# Patient Record
Sex: Female | Born: 1970 | Race: White | Hispanic: No | Marital: Married | Smoking: Former smoker
Health system: Southern US, Community
[De-identification: ages and names within clinical notes are randomized; demographics above are authoritative.]

## PROBLEM LIST (undated history)

## (undated) DIAGNOSIS — E042 Nontoxic multinodular goiter: Secondary | ICD-10-CM

## (undated) DIAGNOSIS — G709 Myoneural disorder, unspecified: Secondary | ICD-10-CM

## (undated) DIAGNOSIS — T7840XA Allergy, unspecified, initial encounter: Secondary | ICD-10-CM

## (undated) DIAGNOSIS — K219 Gastro-esophageal reflux disease without esophagitis: Secondary | ICD-10-CM

## (undated) DIAGNOSIS — K222 Esophageal obstruction: Secondary | ICD-10-CM

## (undated) DIAGNOSIS — R7301 Impaired fasting glucose: Secondary | ICD-10-CM

## (undated) DIAGNOSIS — E785 Hyperlipidemia, unspecified: Secondary | ICD-10-CM

## (undated) HISTORY — PX: COLONOSCOPY: SHX174

## (undated) HISTORY — DX: Esophageal obstruction: K22.2

## (undated) HISTORY — DX: Myoneural disorder, unspecified: G70.9

## (undated) HISTORY — DX: Allergy, unspecified, initial encounter: T78.40XA

## (undated) HISTORY — DX: Impaired fasting glucose: R73.01

## (undated) HISTORY — DX: Hyperlipidemia, unspecified: E78.5

## (undated) HISTORY — DX: Gastro-esophageal reflux disease without esophagitis: K21.9

## (undated) HISTORY — PX: OTHER SURGICAL HISTORY: SHX169

## (undated) HISTORY — DX: Nontoxic multinodular goiter: E04.2

## (undated) HISTORY — PX: UPPER GASTROINTESTINAL ENDOSCOPY: SHX188

---

## 1998-11-23 ENCOUNTER — Other Ambulatory Visit: Admission: RE | Admit: 1998-11-23 | Discharge: 1998-11-23 | Payer: Self-pay | Admitting: Obstetrics and Gynecology

## 1999-08-23 ENCOUNTER — Other Ambulatory Visit: Admission: RE | Admit: 1999-08-23 | Discharge: 1999-08-23 | Payer: Self-pay | Admitting: Obstetrics and Gynecology

## 2005-04-26 ENCOUNTER — Inpatient Hospital Stay (HOSPITAL_COMMUNITY): Admission: AD | Admit: 2005-04-26 | Discharge: 2005-04-26 | Payer: Self-pay | Admitting: Obstetrics & Gynecology

## 2005-06-07 ENCOUNTER — Other Ambulatory Visit: Admission: RE | Admit: 2005-06-07 | Discharge: 2005-06-07 | Payer: Self-pay | Admitting: Obstetrics and Gynecology

## 2005-12-24 ENCOUNTER — Other Ambulatory Visit: Admission: RE | Admit: 2005-12-24 | Discharge: 2005-12-24 | Payer: Self-pay | Admitting: Obstetrics and Gynecology

## 2006-07-16 ENCOUNTER — Inpatient Hospital Stay (HOSPITAL_COMMUNITY): Admission: RE | Admit: 2006-07-16 | Discharge: 2006-07-18 | Payer: Self-pay | Admitting: Obstetrics and Gynecology

## 2011-10-26 ENCOUNTER — Encounter: Payer: Self-pay | Admitting: Internal Medicine

## 2011-11-19 ENCOUNTER — Ambulatory Visit (INDEPENDENT_AMBULATORY_CARE_PROVIDER_SITE_OTHER): Payer: 59 | Admitting: Internal Medicine

## 2011-11-19 ENCOUNTER — Encounter: Payer: Self-pay | Admitting: Internal Medicine

## 2011-11-19 VITALS — BP 120/70 | HR 76 | Ht 69.0 in | Wt 170.0 lb

## 2011-11-19 DIAGNOSIS — K219 Gastro-esophageal reflux disease without esophagitis: Secondary | ICD-10-CM

## 2011-11-19 DIAGNOSIS — Z800 Family history of malignant neoplasm of digestive organs: Secondary | ICD-10-CM

## 2011-11-19 MED ORDER — ESOMEPRAZOLE MAGNESIUM 40 MG PO CPDR: 40.0000 mg | DELAYED_RELEASE_CAPSULE | Freq: Every day | ORAL | Status: DC

## 2011-11-19 NOTE — Patient Instructions (Signed)
You have been scheduled for an endoscopy and colonoscopy with propofol.  Please follow the written instructions given to you at your visit today. Please pick up yourprep at the pharmacy within the next 2-3 days.  You have been given samples of Nexium to try.  If it works well you may take Prilosec over the counter.  You have been given some information on GERD to read.

## 2011-11-19 NOTE — Progress Notes (Signed)
HISTORY OF PRESENT ILLNESS:  Natasha Jenkins is a 41 y.o. female with no significant past medical history who presents today regarding chronic reflux and a family history of colon cancer. First, the patient reports a 10 year history of problems with acid reflux. Symptoms including regurgitation, pyrosis, and chest discomfort. She is bothered with symptoms at least 2-3 times per week for which she takes Zantac on demand. She denies dysphagia. Next, there is a family history of colon cancer in her father (mid to late 26s), and maternal grandmother. Mother has a history of precancerous polyps. The patient herself does mention some minor intermittent rectal bleeding as manifested by red blood on the tissue without associated rectal pain. GI review of systems is otherwise negative  REVIEW OF SYSTEMS:  All non-GI ROS entirely negative.  Past Medical History  Diagnosis Date  . GERD (gastroesophageal reflux disease)     patient report    History reviewed. No pertinent past surgical history.  Social History Arelene Moroni  reports that she has been smoking.  She has never used smokeless tobacco. She reports that she drinks alcohol. She reports that she does not use illicit drugs.  family history includes Colon cancer in her father and maternal grandmother; Colon polyps in her mother; Heart disease in her maternal grandmother; and Prostate cancer in her paternal grandfather.  No Known Allergies     PHYSICAL EXAMINATION: Vital signs: BP 120/70  Pulse 76  Ht 5\' 9"  (1.753 m)  Wt 170 lb (77.111 kg)  BMI 25.10 kg/m2  LMP 11/16/2011  Constitutional: generally well-appearing, no acute distress Psychiatric: alert and oriented x3, cooperative Eyes: extraocular movements intact, anicteric, conjunctiva pink Mouth: oral pharynx moist, no lesions Neck: supple no lymphadenopathy Cardiovascular: heart regular rate and rhythm, no murmur Lungs: clear to auscultation bilaterally Abdomen: soft, nontender,  nondistended, no obvious ascites, no peritoneal signs, normal bowel sounds, no organomegaly Rectal:deferred until colonoscopy Extremities: no lower extremity edema bilaterally Skin: no lesions on visible extremities Neuro: No focal deficits.   ASSESSMENT:  #1. Chronic GERD requiring chronic H2 receptor antagonist therapy. #2. Family history of colon cancer in a first (father in his 17s) and second-degree relative. #3. Trivial rectal bleeding.    PLAN:  #1. Reflux precautions #2. Nexium 40 mg daily samples provided. This is helpful, then she can pick up Prevacid therapy she or we would be happy to call in a prescription PPI. #3. Screening colonoscopy. She is an appropriate candidate without contraindication.The nature of the procedure, as well as the risks, benefits, and alternatives were carefully and thoroughly reviewed with the patient. Ample time for discussion and questions allowed. The patient understood, was satisfied, and agreed to proceed. Movi prep prescribed. Patient instructed on its use

## 2011-11-29 ENCOUNTER — Telehealth: Payer: Self-pay | Admitting: Internal Medicine

## 2011-11-29 MED ORDER — PEG-KCL-NACL-NASULF-NA ASC-C 100 G PO SOLR: 1.0000 | Freq: Once | ORAL | Status: DC

## 2011-11-29 NOTE — Telephone Encounter (Signed)
Rx sent to pharmacy   

## 2011-11-29 NOTE — Telephone Encounter (Signed)
Pt aware.

## 2011-12-10 ENCOUNTER — Ambulatory Visit (AMBULATORY_SURGERY_CENTER): Payer: 59 | Admitting: Internal Medicine

## 2011-12-10 ENCOUNTER — Encounter: Payer: Self-pay | Admitting: Internal Medicine

## 2011-12-10 VITALS — BP 129/82 | HR 80 | Temp 97.5°F | Resp 20 | Ht 69.0 in | Wt 170.0 lb

## 2011-12-10 DIAGNOSIS — Z1211 Encounter for screening for malignant neoplasm of colon: Secondary | ICD-10-CM

## 2011-12-10 DIAGNOSIS — K222 Esophageal obstruction: Secondary | ICD-10-CM

## 2011-12-10 DIAGNOSIS — Z800 Family history of malignant neoplasm of digestive organs: Secondary | ICD-10-CM

## 2011-12-10 DIAGNOSIS — K219 Gastro-esophageal reflux disease without esophagitis: Secondary | ICD-10-CM

## 2011-12-10 MED ORDER — SODIUM CHLORIDE 0.9 % IV SOLN: 500.0000 mL | INTRAVENOUS | Status: DC

## 2011-12-10 MED ORDER — OMEPRAZOLE 20 MG PO CPDR: 20.0000 mg | DELAYED_RELEASE_CAPSULE | Freq: Every day | ORAL | Status: DC

## 2011-12-10 NOTE — Progress Notes (Signed)
Patient did not experience any of the following events: a burn prior to discharge; a fall within the facility; wrong site/side/patient/procedure/implant event; or a hospital transfer or hospital admission upon discharge from the facility. (G8907) Patient did not have preoperative order for IV antibiotic SSI prophylaxis. (G8918)  

## 2011-12-10 NOTE — Op Note (Signed)
[facility] [address] [place], [place]  [address]  COLONOSCOPY PROCEDURE REPORT  PATIENT:  Natasha Jenkins, Natasha Jenkins  MR#:  [id number] BIRTHDATE:  1971/05/27, 40 yrs. old  GENDER:  female ENDOSCOPIST:  Wilhemina Bonito. Eda Keys, MD REF. BY:  Kari Baars, M.D. PROCEDURE DATE:  12/10/2011 PROCEDURE:  Higher-risk screening colonoscopy G0105  ASA CLASS:  Class I INDICATIONS:  Elevated Risk Screening, family history of colon cancer ; father w/ CRC 40's MEDICATIONS:   MAC sedation, administered by CRNA, propofol (Diprivan) 280 mg IV  DESCRIPTION OF PROCEDURE:   After the risks benefits and alternatives of the procedure were thoroughly explained, informed consent was obtained.  Digital rectal exam was performed and revealed no abnormalities.   The LB 180AL [id number] endoscope was introduced through the anus and advanced to the cecum, which was identified by both the appendix and ileocecal valve, without limitations.  The quality of the prep was excellent, using MoviPrep.  The instrument was then slowly withdrawn as the colon was fully examined. <<PROCEDUREIMAGES>>  FINDINGS:  A normal appearing cecum, ileocecal valve, and appendiceal orifice were identified. The ascending, hepatic flexure, transverse, splenic flexure, descending, sigmoid colon, and rectum appeared unremarkable.  No polyps or cancers were seen. Retroflexed views in the rectum revealed no abnormalities.  The time to cecum = 2:09  minutes. The scope was then withdrawn in 8:23  minutes from the cecum and the procedure completed.  COMPLICATIONS:  None  ENDOSCOPIC IMPRESSION: 1) Normal colon 2) No polyps or cancers  RECOMMENDATIONS: 1) Follow up colonoscopy in 5 years (Family hx)  ______________________________ Wilhemina Bonito. Eda Keys, MD  CC:  Kari Baars, MD; The Patient  n. eSIGNED:   Wilhemina Bonito. Eda Keys at 12/10/2011 02:03 PM  Liam Graham, [phone]

## 2011-12-10 NOTE — Op Note (Signed)
[facility] [address] [place], [place]  [address]  ENDOSCOPY PROCEDURE REPORT  PATIENT:  Natasha, Jenkins  MR#:  [id number] BIRTHDATE:  19-Sep-1971, 40 yrs. old  GENDER:  female  ENDOSCOPIST:  Wilhemina Bonito. Eda Keys, MD Referred by:  Kari Baars, M.D.  PROCEDURE DATE:  12/10/2011 PROCEDURE:  EGD, diagnostic 43235 ASA CLASS:  Class I INDICATIONS:  GERD - chronic  MEDICATIONS:   MAC sedation, administered by CRNA, propofol (Diprivan) 120 mg IV TOPICAL ANESTHETIC:  none  DESCRIPTION OF PROCEDURE:   After the risks benefits and alternatives of the procedure were thoroughly explained, informed consent was obtained.  The LB GIF-H180 [id number] endoscope was introduced through the mouth and advanced to the second portion of the duodenum, without limitations.  The instrument was slowly withdrawn as the mucosa was fully examined. <<PROCEDUREIMAGES>>  Esophagitis (edema and erythema) was found in the distal esophagus.  A benign ring-like 15mm stricture was found in the distal esophagus.  No Barrett's. The stomach was entered and closely examined. The antrum, angularis, and lesser curvature were well visualized, including a retroflexed view of the cardia and fundus. The stomach wall was normally distensable. The scope passed easily through the pylorus into the duodenum.  The duodenal bulb was normal in appearance, as was the postbulbar duodenum. Retroflexed views revealed a small hiatal hernia.    The scope was then withdrawn from the patient and the procedure completed.  COMPLICATIONS:  None  ENDOSCOPIC IMPRESSION: 1) Esophagitis in the distal esophagus 2) Stricture in the distal esophagus 3) Normal stomach 4) Normal duodenum 5) GERD  RECOMMENDATIONS: 1) Anti-reflux regimen to be followed 2) PRILOSEC OTC 20 MG DAILY 3) OFFICE FOLLOW UP IN 8 WEEKS  ______________________________ Wilhemina Bonito. Eda Keys, MD  CC:  Kari Baars, MD; The Patient  n. eSIGNED:   Wilhemina Bonito.  Eda Keys at 12/10/2011 02:15 PM  Liam Graham, [phone]

## 2011-12-10 NOTE — Patient Instructions (Signed)

## 2011-12-11 ENCOUNTER — Telehealth: Payer: Self-pay

## 2011-12-11 NOTE — Telephone Encounter (Signed)
  Follow up Call-  Call back number 12/10/2011  Post procedure Call Back phone  # [phone]  Permission to leave phone message Yes     Patient questions:  Do you have a fever, pain , or abdominal swelling? no Pain Score  0 *  Have you tolerated food without any problems? yes  Have you been able to return to your normal activities? yes  Do you have any questions about your discharge instructions: Diet   no Medications  no Follow up visit  no  Do you have questions or concerns about your Care? no  Actions: * If pain score is 4 or above: No action needed, pain <4.

## 2012-01-29 ENCOUNTER — Ambulatory Visit: Payer: 59 | Admitting: Internal Medicine

## 2012-12-11 ENCOUNTER — Other Ambulatory Visit: Payer: Self-pay | Admitting: Internal Medicine

## 2013-10-21 ENCOUNTER — Other Ambulatory Visit: Payer: Self-pay | Admitting: Internal Medicine

## 2014-07-05 ENCOUNTER — Telehealth: Payer: Self-pay

## 2014-07-05 MED ORDER — OMEPRAZOLE 20 MG PO CPDR: 20.0000 mg | DELAYED_RELEASE_CAPSULE | Freq: Every day | ORAL | Status: DC

## 2014-07-05 NOTE — Telephone Encounter (Signed)
Refilled Ompeprazole

## 2016-10-31 ENCOUNTER — Encounter: Payer: Self-pay | Admitting: Internal Medicine

## 2016-11-26 ENCOUNTER — Encounter: Payer: Self-pay | Admitting: Internal Medicine

## 2017-01-28 ENCOUNTER — Ambulatory Visit (AMBULATORY_SURGERY_CENTER): Payer: Self-pay | Admitting: *Deleted

## 2017-01-28 VITALS — Ht 69.0 in | Wt 179.0 lb

## 2017-01-28 DIAGNOSIS — Z800 Family history of malignant neoplasm of digestive organs: Secondary | ICD-10-CM

## 2017-01-28 MED ORDER — NA SULFATE-K SULFATE-MG SULF 17.5-3.13-1.6 GM/177ML PO SOLN: ORAL | 0 refills | Status: DC

## 2017-01-28 NOTE — Progress Notes (Signed)
Patient denies any allergies to eggs or soy. Patient denies any problems with sedation. Patient denies any oxygen use at home and does not take any diet/weight loss medications. EMMI education assisgned to patient on colonoscopy, this was explained and instructions given to patient. 

## 2017-01-30 ENCOUNTER — Encounter: Payer: Self-pay | Admitting: Internal Medicine

## 2017-02-04 ENCOUNTER — Telehealth: Payer: Self-pay | Admitting: Internal Medicine

## 2017-02-04 NOTE — Telephone Encounter (Signed)
Called [facility]- gave coupon number for patient to receive suprep at $50.00  BIN [id number] PCN- [id number] Group- [id number] ID- [id number]  Gave to pharmacist at [facility]

## 2017-02-11 ENCOUNTER — Ambulatory Visit (AMBULATORY_SURGERY_CENTER): Payer: BLUE CROSS/BLUE SHIELD | Admitting: Internal Medicine

## 2017-02-11 ENCOUNTER — Encounter: Payer: Self-pay | Admitting: Internal Medicine

## 2017-02-11 VITALS — BP 109/60 | HR 45 | Temp 98.4°F | Resp 20 | Ht 69.0 in | Wt 179.0 lb

## 2017-02-11 DIAGNOSIS — Z1212 Encounter for screening for malignant neoplasm of rectum: Secondary | ICD-10-CM | POA: Diagnosis not present

## 2017-02-11 DIAGNOSIS — Z800 Family history of malignant neoplasm of digestive organs: Secondary | ICD-10-CM

## 2017-02-11 DIAGNOSIS — Z1211 Encounter for screening for malignant neoplasm of colon: Secondary | ICD-10-CM | POA: Diagnosis not present

## 2017-02-11 MED ORDER — SODIUM CHLORIDE 0.9 % IV SOLN: 500.0000 mL | INTRAVENOUS | Status: AC

## 2017-02-11 NOTE — Progress Notes (Signed)
Pt's states no medical or surgical changes since previsit or office visit. 

## 2017-02-11 NOTE — Patient Instructions (Signed)
YOU HAD AN ENDOSCOPIC PROCEDURE TODAY AT THE [facility]:   Refer to the procedure report that was given to you for any specific questions about what was found during the examination.  If the procedure report does not answer your questions, please call your gastroenterologist to clarify.  If you requested that your care partner not be given the details of your procedure findings, then the procedure report has been included in a sealed envelope for you to review at your convenience later.  YOU SHOULD EXPECT: Some feelings of bloating in the abdomen. Passage of more gas than usual.  Walking can help get rid of the air that was put into your GI tract during the procedure and reduce the bloating. If you had a lower endoscopy (such as a colonoscopy or flexible sigmoidoscopy) you may notice spotting of blood in your stool or on the toilet paper. If you underwent a bowel prep for your procedure, you may not have a normal bowel movement for a few days.  Please Note:  You might notice some irritation and congestion in your nose or some drainage.  This is from the oxygen used during your procedure.  There is no need for concern and it should clear up in a day or so.  SYMPTOMS TO REPORT IMMEDIATELY:   Following lower endoscopy (colonoscopy or flexible sigmoidoscopy):  Excessive amounts of blood in the stool  Significant tenderness or worsening of abdominal pains  Swelling of the abdomen that is new, acute  Fever of 100F or higher   For urgent or emergent issues, a gastroenterologist can be reached at any hour by calling [phone].   DIET:  We do recommend a small meal at first, but then you may proceed to your regular diet.  Drink plenty of fluids but you should avoid alcoholic beverages for 24 hours.  ACTIVITY:  You should plan to take it easy for the rest of today and you should NOT DRIVE or use heavy machinery until tomorrow (because of the sedation medicines used during the test).     FOLLOW UP: Our staff will call the number listed on your records the next business day following your procedure to check on you and address any questions or concerns that you may have regarding the information given to you following your procedure. If we do not reach you, we will leave a message.  However, if you are feeling well and you are not experiencing any problems, there is no need to return our call.  We will assume that you have returned to your regular daily activities without incident.  If any biopsies were taken you will be contacted by phone or by letter within the next 1-3 weeks.  Please call us at [phone] if you have not heard about the biopsies in 3 weeks.    SIGNATURES/CONFIDENTIALITY: You and/or your care partner have signed paperwork which will be entered into your electronic medical record.  These signatures attest to the fact that that the information above on your After Visit Summary has been reviewed and is understood.  Full responsibility of the confidentiality of this discharge information lies with you and/or your care-partner.  Thank you for letting us take care of your healthcare needs today. 

## 2017-02-11 NOTE — Op Note (Signed)
[facility] Patient Name: Natasha Jenkins Procedure Date: 02/11/2017 8:40 AM MRN: [id number] Endoscopist: Docia Chuck. Henrene Pastor , MD Age: 46 Referring MD:  Date of Birth: 11-07-1970 Gender: Female Account #: [id number] Procedure:                Colonoscopy Indications:              Screening in patient at increased risk: Colorectal                            cancer in father before age 72. Also, maternal GP.                            Index exam 11-2011 negative Medicines:                Monitored Anesthesia Care Procedure:                Pre-Anesthesia Assessment:                           - Prior to the procedure, a History and Physical                            was performed, and patient medications and                            allergies were reviewed. The patient's tolerance of                            previous anesthesia was also reviewed. The risks                            and benefits of the procedure and the sedation                            options and risks were discussed with the patient.                            All questions were answered, and informed consent                            was obtained. Prior Anticoagulants: The patient has                            taken no previous anticoagulant or antiplatelet                            agents. ASA Grade Assessment: I - A normal, healthy                            patient. After reviewing the risks and benefits,                            the patient was deemed in satisfactory condition to  undergo the procedure.                           After obtaining informed consent, the colonoscope                            was passed under direct vision. Throughout the                            procedure, the patient's blood pressure, pulse, and                            oxygen saturations were monitored continuously. The                            Model [id number]) scope was  introduced                            through the anus and advanced to the the cecum,                            identified by appendiceal orifice and ileocecal                            valve. The ileocecal valve, appendiceal orifice,                            and rectum were photographed. The quality of the                            bowel preparation was excellent. The colonoscopy                            was performed without difficulty. The patient                            tolerated the procedure well. The bowel preparation                            used was SUPREP. Scope In: 8:55:30 AM Scope Out: 9:06:05 AM Scope Withdrawal Time: 0 hours 8 minutes 53 seconds  Total Procedure Duration: 0 hours 10 minutes 35 seconds  Findings:                 The entire examined colon appeared normal on direct                            and retroflexion views. Small internal hemorrhoids                            present. No polyps. Complications:            No immediate complications. Estimated blood loss:                            None. Estimated  Blood Loss:     Estimated blood loss: none. Impression:               - The entire examined colon is normal on direct and                            retroflexion views.                           - No specimens collected. Recommendation:           - Repeat colonoscopy in 5 years for screening                            purposes (Fam hx).                           - Patient has a contact number available for                            emergencies. The signs and symptoms of potential                            delayed complications were discussed with the                            patient. Return to normal activities tomorrow.                            Written discharge instructions were provided to the                            patient.                           - Resume previous diet.                           - Continue present  medications. Docia Chuck. Henrene Pastor, MD 02/11/2017 9:11:17 AM This report has been signed electronically.

## 2017-02-11 NOTE — Progress Notes (Signed)
Report to PACU, RN, vss, BBS= Clear.  

## 2017-02-12 ENCOUNTER — Telehealth: Payer: Self-pay | Admitting: *Deleted

## 2017-02-12 NOTE — Telephone Encounter (Signed)
No answer. Name identifier. Message left to call if questions or concerns and we would attempt to call later in the day. 

## 2017-02-12 NOTE — Telephone Encounter (Signed)
  Follow up Call-  Call back number 02/11/2017  Post procedure Call Back phone  # [phone]  Permission to leave phone message Yes  Some recent data might be hidden    [facility]

## 2019-06-05 DIAGNOSIS — Z0000 Encounter for general adult medical examination without abnormal findings: Secondary | ICD-10-CM | POA: Diagnosis not present

## 2019-06-05 DIAGNOSIS — M545 Low back pain: Secondary | ICD-10-CM | POA: Diagnosis not present

## 2019-06-05 DIAGNOSIS — R7301 Impaired fasting glucose: Secondary | ICD-10-CM | POA: Diagnosis not present

## 2019-06-08 DIAGNOSIS — Z01419 Encounter for gynecological examination (general) (routine) without abnormal findings: Secondary | ICD-10-CM | POA: Diagnosis not present

## 2019-06-08 DIAGNOSIS — Z6827 Body mass index (BMI) 27.0-27.9, adult: Secondary | ICD-10-CM | POA: Diagnosis not present

## 2019-06-08 DIAGNOSIS — Z1231 Encounter for screening mammogram for malignant neoplasm of breast: Secondary | ICD-10-CM | POA: Diagnosis not present

## 2019-06-11 DIAGNOSIS — R42 Dizziness and giddiness: Secondary | ICD-10-CM | POA: Diagnosis not present

## 2019-06-11 DIAGNOSIS — Z0000 Encounter for general adult medical examination without abnormal findings: Secondary | ICD-10-CM | POA: Diagnosis not present

## 2019-06-11 DIAGNOSIS — K219 Gastro-esophageal reflux disease without esophagitis: Secondary | ICD-10-CM | POA: Diagnosis not present

## 2019-06-11 DIAGNOSIS — Z800 Family history of malignant neoplasm of digestive organs: Secondary | ICD-10-CM | POA: Diagnosis not present

## 2019-06-11 DIAGNOSIS — Z1331 Encounter for screening for depression: Secondary | ICD-10-CM | POA: Diagnosis not present

## 2019-06-11 DIAGNOSIS — R7301 Impaired fasting glucose: Secondary | ICD-10-CM | POA: Diagnosis not present

## 2019-06-26 DIAGNOSIS — D2271 Melanocytic nevi of right lower limb, including hip: Secondary | ICD-10-CM | POA: Diagnosis not present

## 2019-06-26 DIAGNOSIS — D225 Melanocytic nevi of trunk: Secondary | ICD-10-CM | POA: Diagnosis not present

## 2019-06-26 DIAGNOSIS — L821 Other seborrheic keratosis: Secondary | ICD-10-CM | POA: Diagnosis not present

## 2019-06-26 DIAGNOSIS — L814 Other melanin hyperpigmentation: Secondary | ICD-10-CM | POA: Diagnosis not present

## 2019-06-26 DIAGNOSIS — L570 Actinic keratosis: Secondary | ICD-10-CM | POA: Diagnosis not present

## 2019-07-22 ENCOUNTER — Other Ambulatory Visit: Payer: Self-pay

## 2019-07-22 DIAGNOSIS — Z20822 Contact with and (suspected) exposure to covid-19: Secondary | ICD-10-CM

## 2019-07-23 LAB — NOVEL CORONAVIRUS, NAA: SARS-CoV-2, NAA: NOT DETECTED

## 2019-09-11 ENCOUNTER — Other Ambulatory Visit: Payer: Self-pay

## 2019-09-11 DIAGNOSIS — Z20822 Contact with and (suspected) exposure to covid-19: Secondary | ICD-10-CM

## 2019-09-14 LAB — NOVEL CORONAVIRUS, NAA: SARS-CoV-2, NAA: NOT DETECTED

## 2019-10-28 ENCOUNTER — Ambulatory Visit: Payer: BC Managed Care – PPO | Attending: Internal Medicine

## 2019-10-28 DIAGNOSIS — M25512 Pain in left shoulder: Secondary | ICD-10-CM | POA: Diagnosis not present

## 2019-10-28 DIAGNOSIS — Z20822 Contact with and (suspected) exposure to covid-19: Secondary | ICD-10-CM | POA: Insufficient documentation

## 2019-10-29 LAB — NOVEL CORONAVIRUS, NAA: SARS-CoV-2, NAA: NOT DETECTED

## 2019-11-02 DIAGNOSIS — R030 Elevated blood-pressure reading, without diagnosis of hypertension: Secondary | ICD-10-CM | POA: Diagnosis not present

## 2019-11-02 DIAGNOSIS — R221 Localized swelling, mass and lump, neck: Secondary | ICD-10-CM | POA: Diagnosis not present

## 2019-11-02 DIAGNOSIS — E079 Disorder of thyroid, unspecified: Secondary | ICD-10-CM | POA: Diagnosis not present

## 2019-11-03 ENCOUNTER — Other Ambulatory Visit: Payer: Self-pay | Admitting: Internal Medicine

## 2019-11-03 DIAGNOSIS — E079 Disorder of thyroid, unspecified: Secondary | ICD-10-CM

## 2019-11-03 DIAGNOSIS — R221 Localized swelling, mass and lump, neck: Secondary | ICD-10-CM

## 2019-11-10 ENCOUNTER — Ambulatory Visit
Admission: RE | Admit: 2019-11-10 | Discharge: 2019-11-10 | Disposition: A | Discharge status: Home / Self Care | Payer: BC Managed Care – PPO | Source: Ambulatory Visit | Attending: Internal Medicine | Admitting: Internal Medicine

## 2019-11-10 DIAGNOSIS — R221 Localized swelling, mass and lump, neck: Secondary | ICD-10-CM

## 2019-11-10 DIAGNOSIS — E079 Disorder of thyroid, unspecified: Secondary | ICD-10-CM

## 2019-12-15 ENCOUNTER — Other Ambulatory Visit: Payer: Self-pay | Admitting: Internal Medicine

## 2019-12-15 DIAGNOSIS — E042 Nontoxic multinodular goiter: Secondary | ICD-10-CM

## 2019-12-28 DIAGNOSIS — Z20822 Contact with and (suspected) exposure to covid-19: Secondary | ICD-10-CM | POA: Diagnosis not present

## 2019-12-30 DIAGNOSIS — Z20828 Contact with and (suspected) exposure to other viral communicable diseases: Secondary | ICD-10-CM | POA: Diagnosis not present

## 2019-12-30 DIAGNOSIS — Z20822 Contact with and (suspected) exposure to covid-19: Secondary | ICD-10-CM | POA: Diagnosis not present

## 2020-01-05 DIAGNOSIS — U071 COVID-19: Secondary | ICD-10-CM | POA: Diagnosis not present

## 2020-05-09 ENCOUNTER — Ambulatory Visit
Admission: RE | Admit: 2020-05-09 | Discharge: 2020-05-09 | Disposition: A | Discharge status: Home / Self Care | Payer: BC Managed Care – PPO | Source: Ambulatory Visit | Attending: Internal Medicine | Admitting: Internal Medicine

## 2020-05-09 DIAGNOSIS — E041 Nontoxic single thyroid nodule: Secondary | ICD-10-CM | POA: Diagnosis not present

## 2020-05-09 DIAGNOSIS — E042 Nontoxic multinodular goiter: Secondary | ICD-10-CM

## 2020-06-20 DIAGNOSIS — Z01419 Encounter for gynecological examination (general) (routine) without abnormal findings: Secondary | ICD-10-CM | POA: Diagnosis not present

## 2020-06-20 DIAGNOSIS — Z6827 Body mass index (BMI) 27.0-27.9, adult: Secondary | ICD-10-CM | POA: Diagnosis not present

## 2020-06-20 DIAGNOSIS — N920 Excessive and frequent menstruation with regular cycle: Secondary | ICD-10-CM | POA: Diagnosis not present

## 2020-06-20 DIAGNOSIS — Z1231 Encounter for screening mammogram for malignant neoplasm of breast: Secondary | ICD-10-CM | POA: Diagnosis not present

## 2020-06-29 DIAGNOSIS — R7301 Impaired fasting glucose: Secondary | ICD-10-CM | POA: Diagnosis not present

## 2020-06-29 DIAGNOSIS — Z0000 Encounter for general adult medical examination without abnormal findings: Secondary | ICD-10-CM | POA: Diagnosis not present

## 2020-06-29 DIAGNOSIS — E785 Hyperlipidemia, unspecified: Secondary | ICD-10-CM | POA: Diagnosis not present

## 2020-06-29 DIAGNOSIS — E042 Nontoxic multinodular goiter: Secondary | ICD-10-CM | POA: Diagnosis not present

## 2020-07-05 DIAGNOSIS — Z23 Encounter for immunization: Secondary | ICD-10-CM | POA: Diagnosis not present

## 2020-07-05 DIAGNOSIS — R030 Elevated blood-pressure reading, without diagnosis of hypertension: Secondary | ICD-10-CM | POA: Diagnosis not present

## 2020-07-05 DIAGNOSIS — Z1331 Encounter for screening for depression: Secondary | ICD-10-CM | POA: Diagnosis not present

## 2020-07-05 DIAGNOSIS — Z0000 Encounter for general adult medical examination without abnormal findings: Secondary | ICD-10-CM | POA: Diagnosis not present

## 2020-07-26 DIAGNOSIS — N924 Excessive bleeding in the premenopausal period: Secondary | ICD-10-CM | POA: Diagnosis not present

## 2020-08-12 DIAGNOSIS — Z1212 Encounter for screening for malignant neoplasm of rectum: Secondary | ICD-10-CM | POA: Diagnosis not present

## 2020-08-25 DIAGNOSIS — N920 Excessive and frequent menstruation with regular cycle: Secondary | ICD-10-CM | POA: Diagnosis not present

## 2020-09-08 DIAGNOSIS — Z09 Encounter for follow-up examination after completed treatment for conditions other than malignant neoplasm: Secondary | ICD-10-CM | POA: Diagnosis not present

## 2020-09-20 DIAGNOSIS — L738 Other specified follicular disorders: Secondary | ICD-10-CM | POA: Diagnosis not present

## 2020-09-20 DIAGNOSIS — L819 Disorder of pigmentation, unspecified: Secondary | ICD-10-CM | POA: Diagnosis not present

## 2020-09-20 DIAGNOSIS — D225 Melanocytic nevi of trunk: Secondary | ICD-10-CM | POA: Diagnosis not present

## 2020-09-20 DIAGNOSIS — D2272 Melanocytic nevi of left lower limb, including hip: Secondary | ICD-10-CM | POA: Diagnosis not present

## 2020-11-11 ENCOUNTER — Other Ambulatory Visit: Payer: Self-pay | Admitting: Internal Medicine

## 2020-11-11 DIAGNOSIS — E042 Nontoxic multinodular goiter: Secondary | ICD-10-CM

## 2020-11-22 ENCOUNTER — Ambulatory Visit
Admission: RE | Admit: 2020-11-22 | Discharge: 2020-11-22 | Disposition: A | Discharge status: Home / Self Care | Payer: BC Managed Care – PPO | Source: Ambulatory Visit | Attending: Internal Medicine | Admitting: Internal Medicine

## 2020-11-22 DIAGNOSIS — E041 Nontoxic single thyroid nodule: Secondary | ICD-10-CM | POA: Diagnosis not present

## 2020-11-22 DIAGNOSIS — E042 Nontoxic multinodular goiter: Secondary | ICD-10-CM

## 2021-07-12 DIAGNOSIS — E041 Nontoxic single thyroid nodule: Secondary | ICD-10-CM | POA: Diagnosis not present

## 2021-07-12 DIAGNOSIS — R7301 Impaired fasting glucose: Secondary | ICD-10-CM | POA: Diagnosis not present

## 2021-07-12 DIAGNOSIS — E785 Hyperlipidemia, unspecified: Secondary | ICD-10-CM | POA: Diagnosis not present

## 2021-07-19 DIAGNOSIS — R7301 Impaired fasting glucose: Secondary | ICD-10-CM | POA: Diagnosis not present

## 2021-07-19 DIAGNOSIS — Z1331 Encounter for screening for depression: Secondary | ICD-10-CM | POA: Diagnosis not present

## 2021-07-19 DIAGNOSIS — Z0000 Encounter for general adult medical examination without abnormal findings: Secondary | ICD-10-CM | POA: Diagnosis not present

## 2021-07-19 DIAGNOSIS — R030 Elevated blood-pressure reading, without diagnosis of hypertension: Secondary | ICD-10-CM | POA: Diagnosis not present

## 2021-07-19 DIAGNOSIS — R82998 Other abnormal findings in urine: Secondary | ICD-10-CM | POA: Diagnosis not present

## 2021-07-19 DIAGNOSIS — Z1339 Encounter for screening examination for other mental health and behavioral disorders: Secondary | ICD-10-CM | POA: Diagnosis not present

## 2021-07-19 DIAGNOSIS — Z23 Encounter for immunization: Secondary | ICD-10-CM | POA: Diagnosis not present

## 2021-07-20 DIAGNOSIS — Z1231 Encounter for screening mammogram for malignant neoplasm of breast: Secondary | ICD-10-CM | POA: Diagnosis not present

## 2021-07-20 DIAGNOSIS — Z6827 Body mass index (BMI) 27.0-27.9, adult: Secondary | ICD-10-CM | POA: Diagnosis not present

## 2021-07-20 DIAGNOSIS — Z01419 Encounter for gynecological examination (general) (routine) without abnormal findings: Secondary | ICD-10-CM | POA: Diagnosis not present

## 2021-09-25 ENCOUNTER — Telehealth: Payer: Self-pay | Admitting: Internal Medicine

## 2021-09-25 NOTE — Telephone Encounter (Signed)
Good Afternoon Dr. Henrene Pastor,  Patient called and canceled her appointment for tomorrow at 10:20 due to not being able to get off work.  We rescheduled for 12/23 9:40am.

## 2021-09-26 ENCOUNTER — Ambulatory Visit: Payer: BC Managed Care – PPO | Admitting: Internal Medicine

## 2021-10-11 ENCOUNTER — Encounter: Payer: Self-pay | Admitting: Internal Medicine

## 2021-10-11 DIAGNOSIS — L821 Other seborrheic keratosis: Secondary | ICD-10-CM | POA: Diagnosis not present

## 2021-10-11 DIAGNOSIS — L738 Other specified follicular disorders: Secondary | ICD-10-CM | POA: Diagnosis not present

## 2021-10-11 DIAGNOSIS — D2262 Melanocytic nevi of left upper limb, including shoulder: Secondary | ICD-10-CM | POA: Diagnosis not present

## 2021-10-11 DIAGNOSIS — D225 Melanocytic nevi of trunk: Secondary | ICD-10-CM | POA: Diagnosis not present

## 2021-10-13 ENCOUNTER — Ambulatory Visit: Payer: BC Managed Care – PPO | Admitting: Internal Medicine

## 2021-11-28 IMAGING — US US THYROID
1 series · 13 of 25 positions shown · non-contrast
Comparison: None.

CLINICAL DATA: Thyroid mass

EXAM:
THYROID ULTRASOUND
TECHNIQUE: Ultrasound examination of the thyroid gland and adjacent soft
tissues was performed.

[Series 1: us thyroid · 0.07mm/px · 13 of 52 slices shown]
[im 1/52]
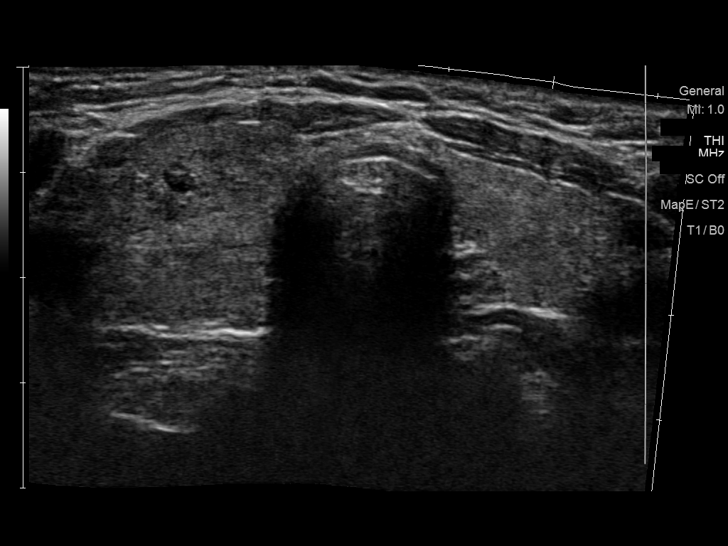
[im 5/52]
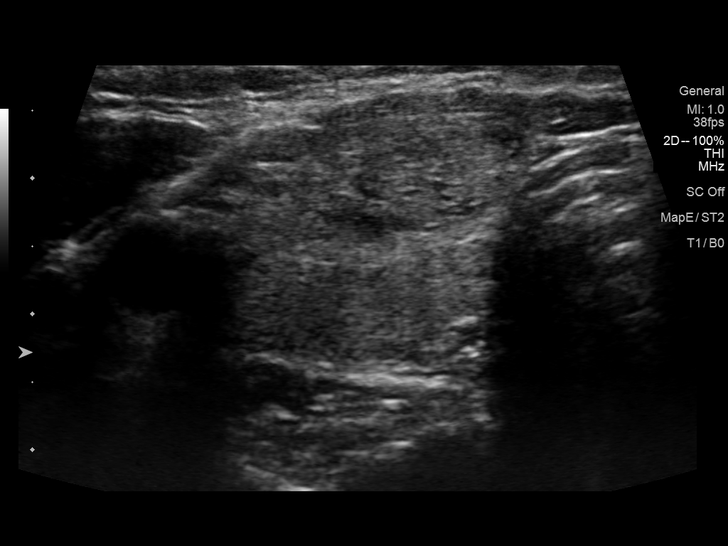
[im 9/52]
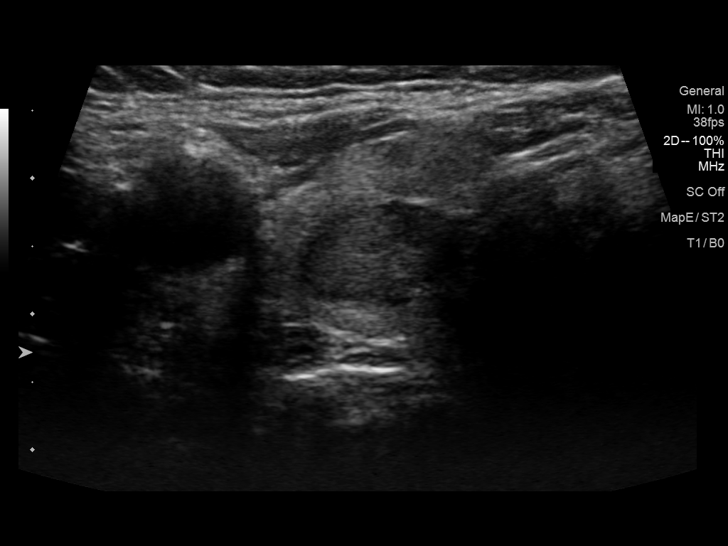
[im 13/52]
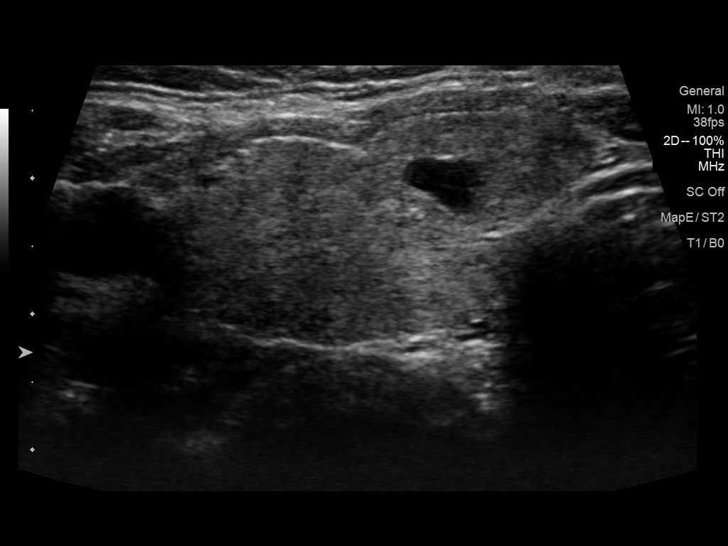
[im 18/52]
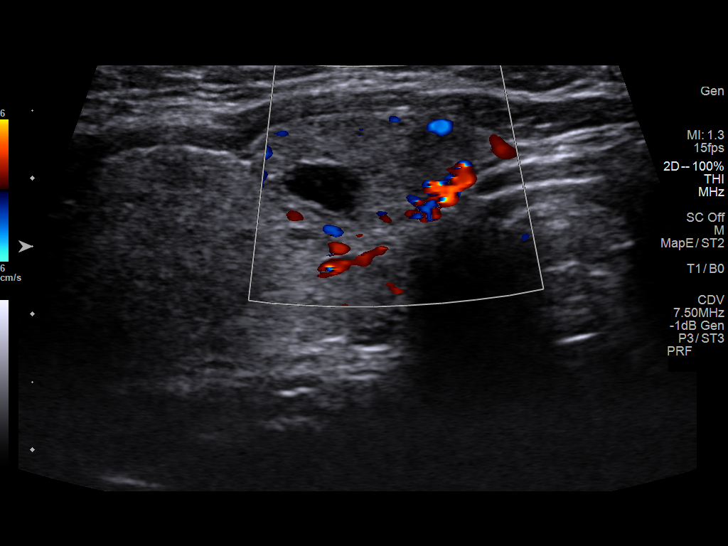
[im 22/52]
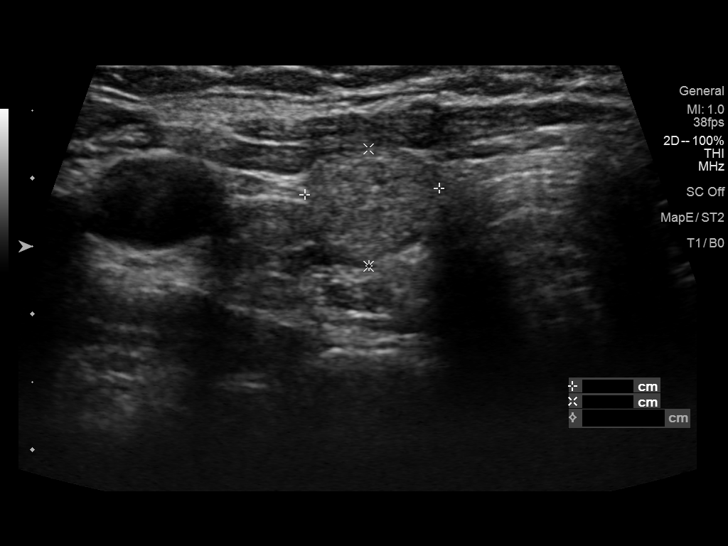
[im 26/52]
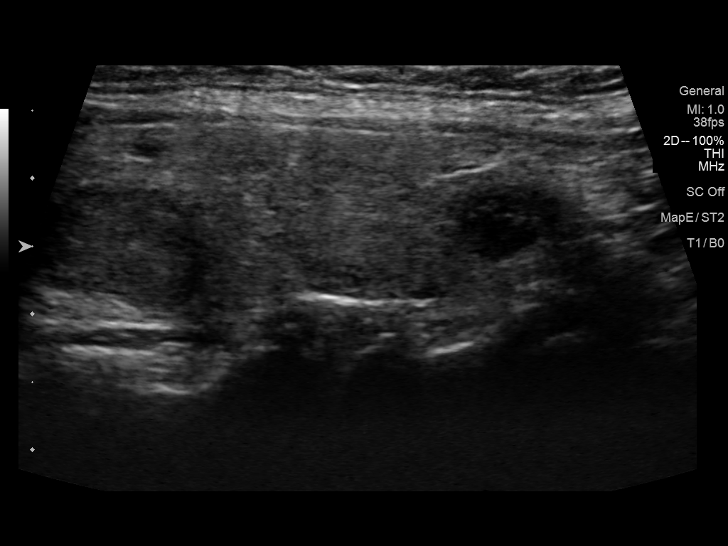
[im 30/52]
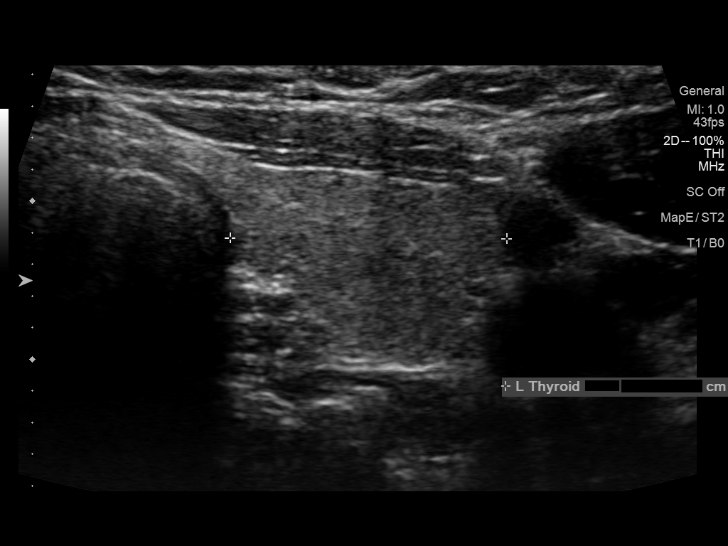
[im 35/52]
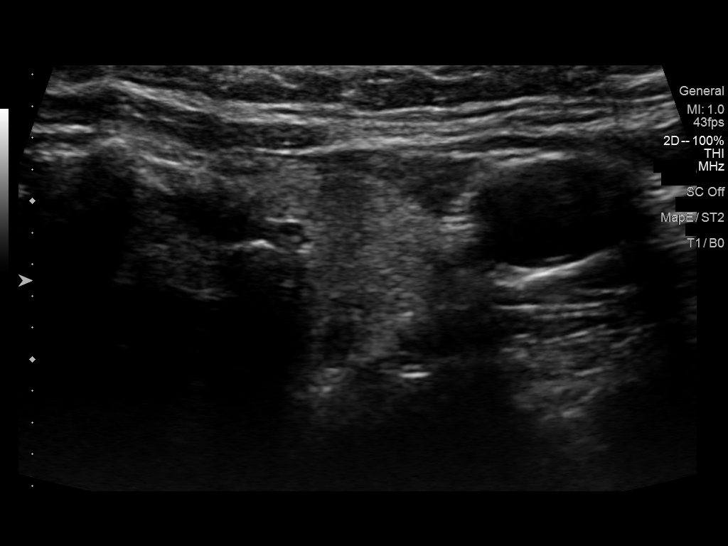
[im 39/52]
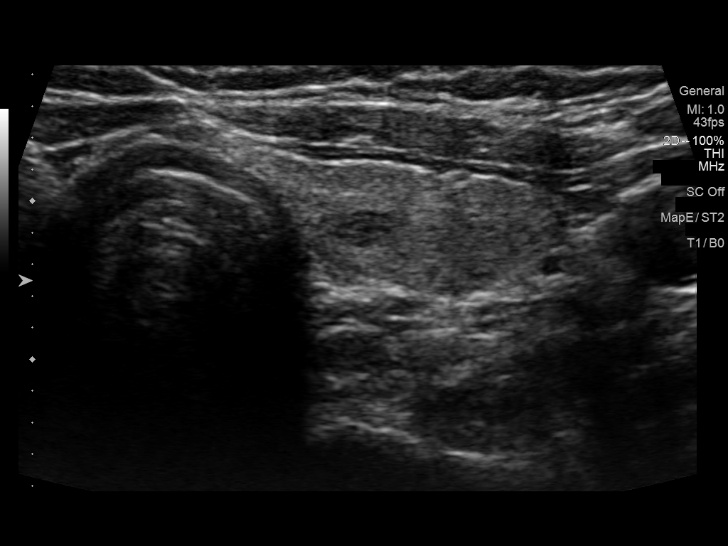
[im 43/52]
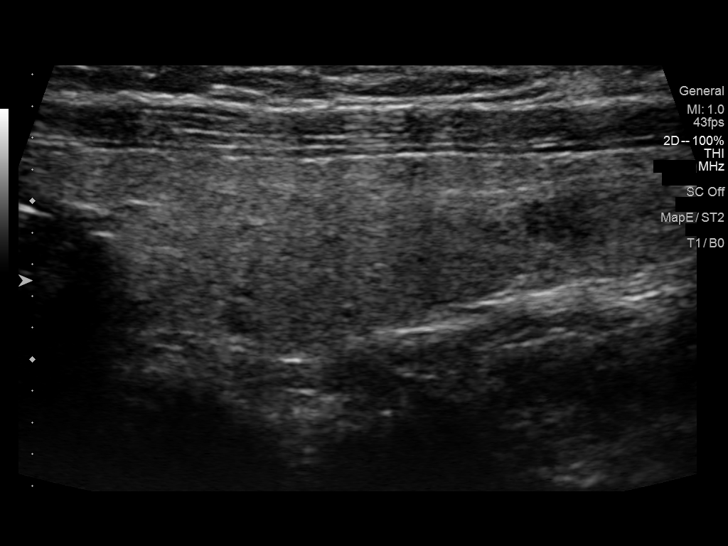
[im 47/52]
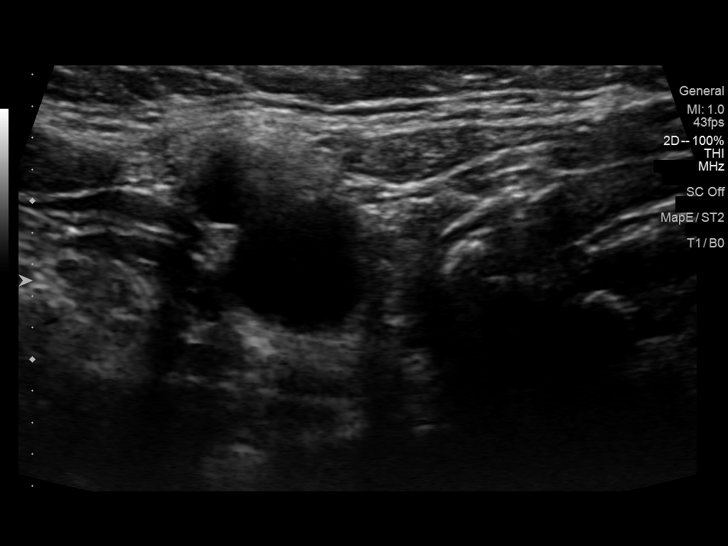
[im 52/52]
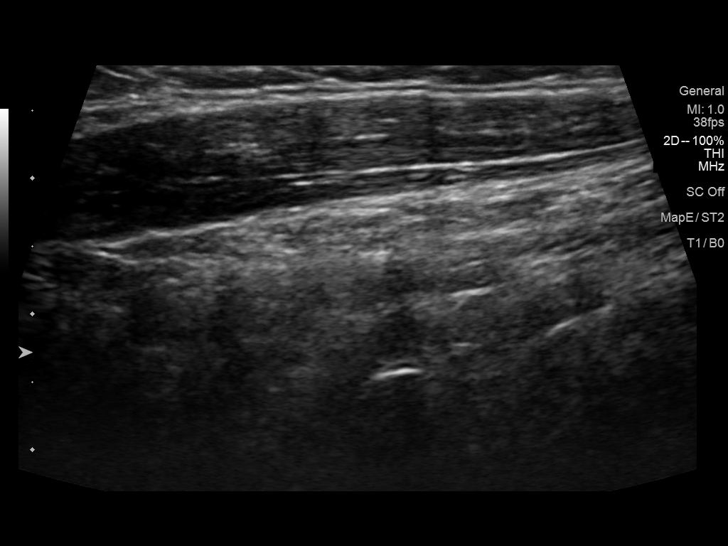

[13 of 25 positions shown; findings below may reference images not displayed]

FINDINGS: Parenchymal Echotexture: Mildly heterogenous

Isthmus: 0.3 cm

Right lobe: 6.4 x 1.9 x 2 cm

Left lobe: 5.3 x 1.6 x 1.8 cm

_________________________________________________________

Estimated total number of nodules >/= 1 cm: 3

Number of spongiform nodules >/=  2 cm not described below (TR1): 0

Number of mixed cystic and solid nodules >/= 1.5 cm not described
below (TR2): 0

_________________________________________________________

Nodule # 1:

Location: Right; Superior

Maximum size: 1.2 cm; Other 2 dimensions: 1.2 x 0.9 cm

Composition: solid/almost completely solid (2)

Echogenicity: hypoechoic (2)

Shape: not taller-than-wide (0)

Margins: smooth (0)

Echogenic foci: none (0)

ACR TI-RADS total points: 4.

ACR TI-RADS risk category: TR4 (4-6 points).

ACR TI-RADS recommendations:

*Given size (>/= 1 - 1.4 cm) and appearance, a follow-up ultrasound
in 1 year should be considered based on TI-RADS criteria.

_________________________________________________________

Nodule # 2:

Location: Right; Mid

Maximum size: 2.8 cm; Other 2 dimensions: 1.7 x 1 cm

Composition: spongiform (0)

Echogenicity: hypoechoic (2)

Shape: not taller-than-wide (0)

Margins: smooth (0)

Echogenic foci: none (0)

ACR TI-RADS total points: 2.

ACR TI-RADS risk category: TR2 (2 points).

ACR TI-RADS recommendations:

This nodule does NOT meet TI-RADS criteria for biopsy or dedicated
follow-up.

_________________________________________________________

There is a 0.9 x 0.6 x 0.7 cm hypoechoic nodule in the right
inferior thyroid gland that does not meet criteria for FNA or
follow-up. There is a 1 cm isoechoic thyroid nodule in the right
inferior thyroid gland that does not meet criteria for FNA or
follow-up.
IMPRESSION: 1. Multinodular goiter as detailed above.
2. There is a 1.2 cm TR 4 thyroid nodule in the right superior
thyroid gland that meets criteria for 1 year follow-up ultrasound.
3. There is a 2.8 cm thyroid nodule in the right mid thyroid gland.
Portions of this nodule appear spongiform. As such, this has been
labeled a TR2 thyroid nodule and no further follow-up is required.

The above is in keeping with the ACR TI-RADS recommendations - [HOSPITAL] 4236;[DATE].

## 2021-11-30 ENCOUNTER — Other Ambulatory Visit: Payer: Self-pay | Admitting: Internal Medicine

## 2021-11-30 DIAGNOSIS — E041 Nontoxic single thyroid nodule: Secondary | ICD-10-CM

## 2021-12-12 ENCOUNTER — Ambulatory Visit
Admission: RE | Admit: 2021-12-12 | Discharge: 2021-12-12 | Disposition: A | Discharge status: Home / Self Care | Payer: BC Managed Care – PPO | Source: Ambulatory Visit | Attending: Internal Medicine | Admitting: Internal Medicine

## 2021-12-12 DIAGNOSIS — E042 Nontoxic multinodular goiter: Secondary | ICD-10-CM | POA: Diagnosis not present

## 2021-12-12 DIAGNOSIS — E041 Nontoxic single thyroid nodule: Secondary | ICD-10-CM

## 2021-12-12 DIAGNOSIS — E010 Iodine-deficiency related diffuse (endemic) goiter: Secondary | ICD-10-CM | POA: Diagnosis not present

## 2022-03-09 ENCOUNTER — Encounter: Payer: Self-pay | Admitting: Internal Medicine

## 2022-04-16 ENCOUNTER — Encounter: Payer: Self-pay | Admitting: Internal Medicine

## 2022-05-08 ENCOUNTER — Ambulatory Visit (AMBULATORY_SURGERY_CENTER): Payer: Self-pay | Admitting: *Deleted

## 2022-05-08 VITALS — Ht 69.0 in | Wt 185.0 lb

## 2022-05-08 DIAGNOSIS — Z800 Family history of malignant neoplasm of digestive organs: Secondary | ICD-10-CM

## 2022-05-08 NOTE — Progress Notes (Signed)
No egg or soy allergy known to patient  No issues known to pt with past sedation with any surgeries or procedures Patient denies ever being told they had issues or difficulty with intubation  No FH of Malignant Hyperthermia Pt is not on diet pills Pt is not on  home 02  Pt is not on blood thinners  Pt denies issues with constipation  No A fib or A flutter Have any cardiac testing pending--pt pt denies    Pt asking for EGD due to dysphagia, GERD, HH, Esophagitis-  Canceled colon 06-05-22, sch OV with Dr Henrene Pastor  per pt request 8-30 at 320 pm- pt wants to have ECL at the same time

## 2022-05-28 IMAGING — US US THYROID
1 series · 13 of 25 positions shown · non-contrast
Comparison: November 10, 2019

CLINICAL DATA: Thyroid nodule follow-up

EXAM:
THYROID ULTRASOUND
TECHNIQUE: Ultrasound examination of the thyroid gland and adjacent soft
tissues was performed.

[Series 1: us thyroid · 0.04mm/px · 13 of 83 slices shown]
[im 1/83]
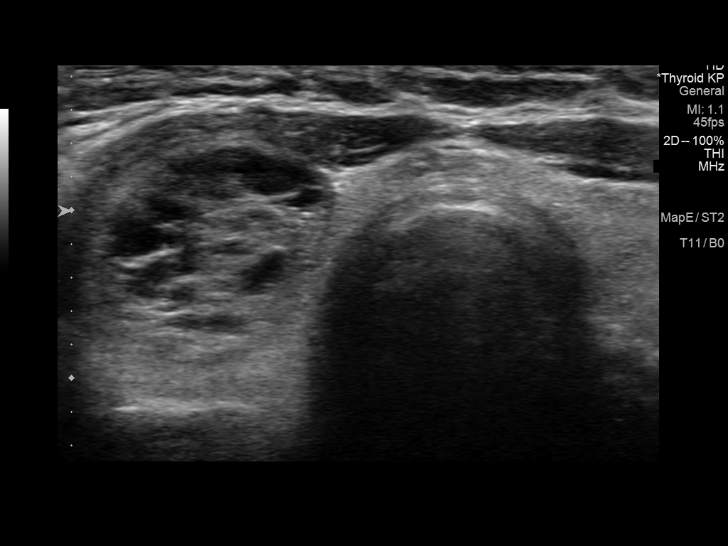
[im 7/83]
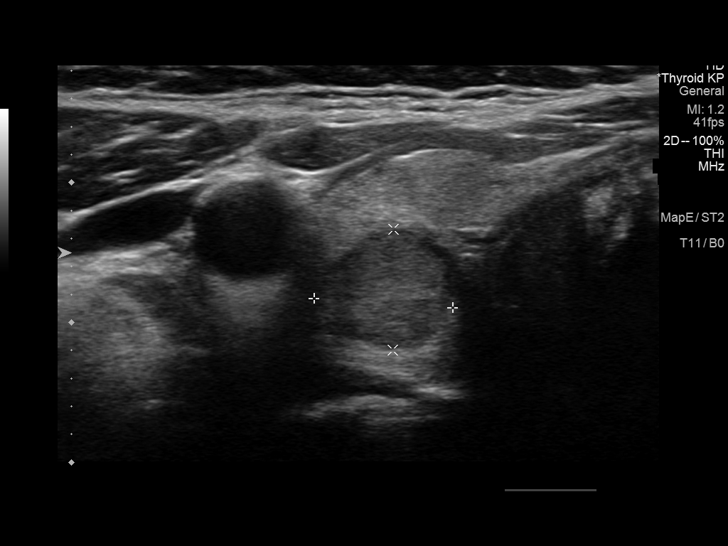
[im 14/83]
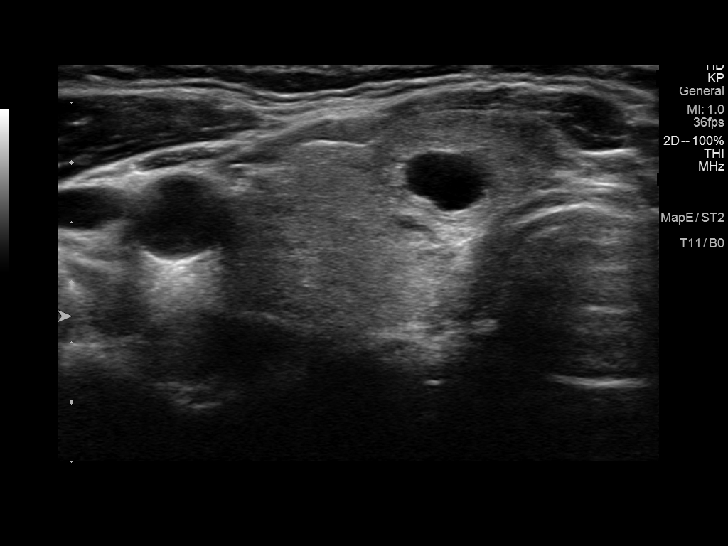
[im 21/83]
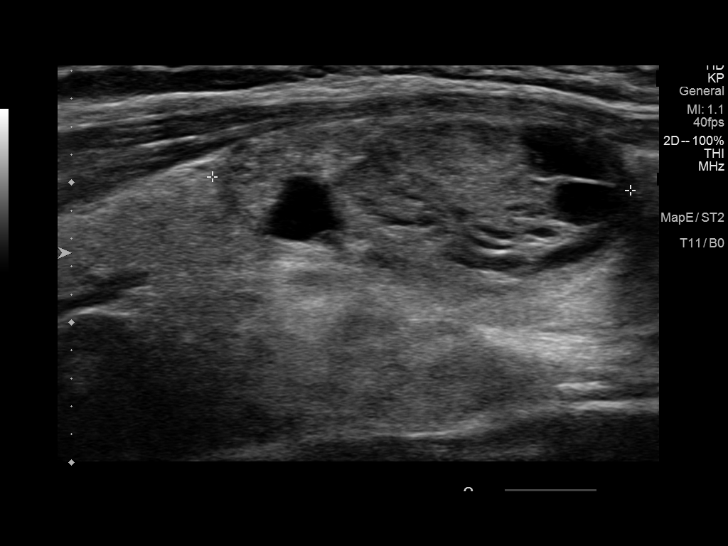
[im 28/83]
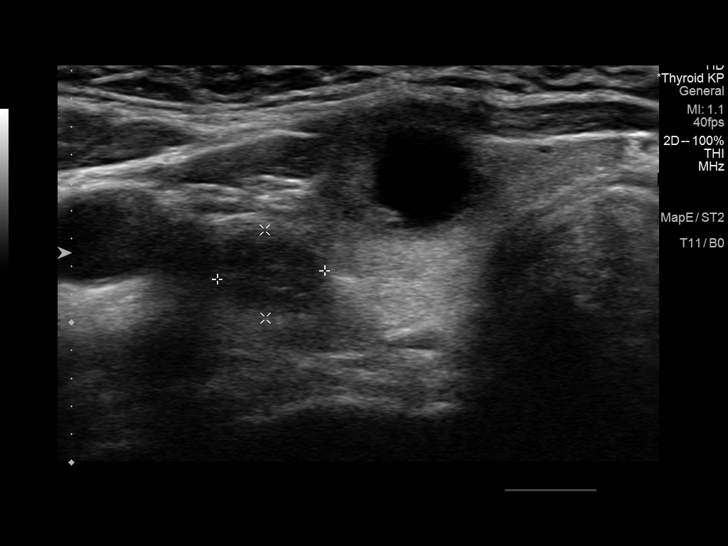
[im 35/83]
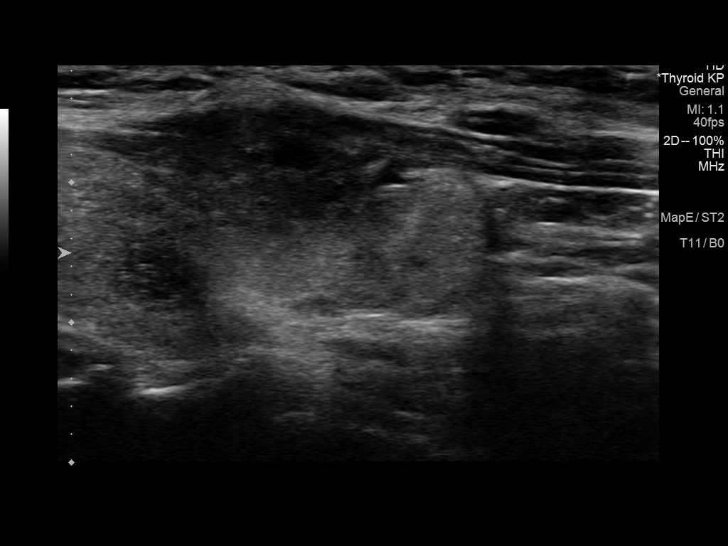
[im 42/83]
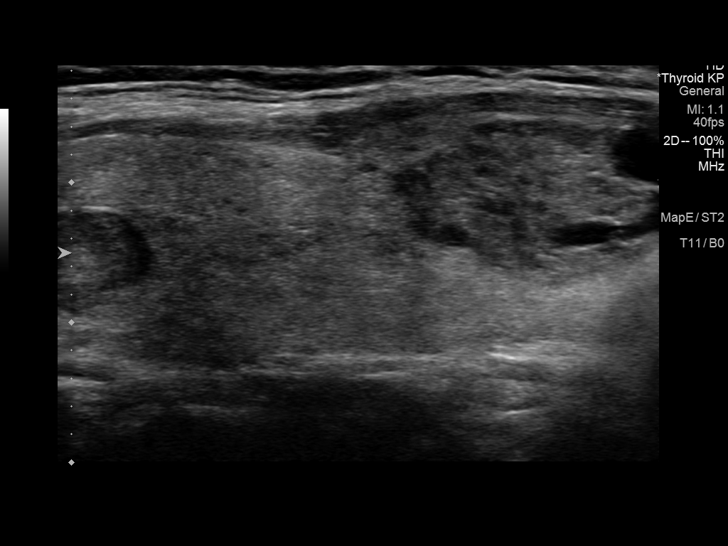
[im 48/83]
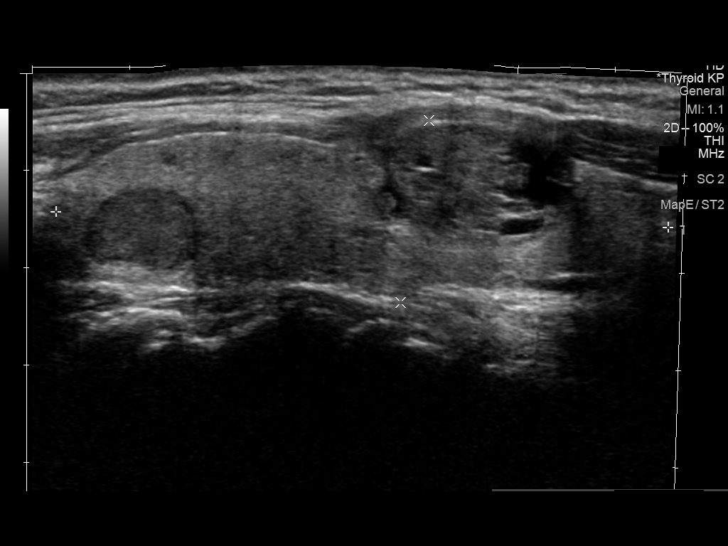
[im 55/83]
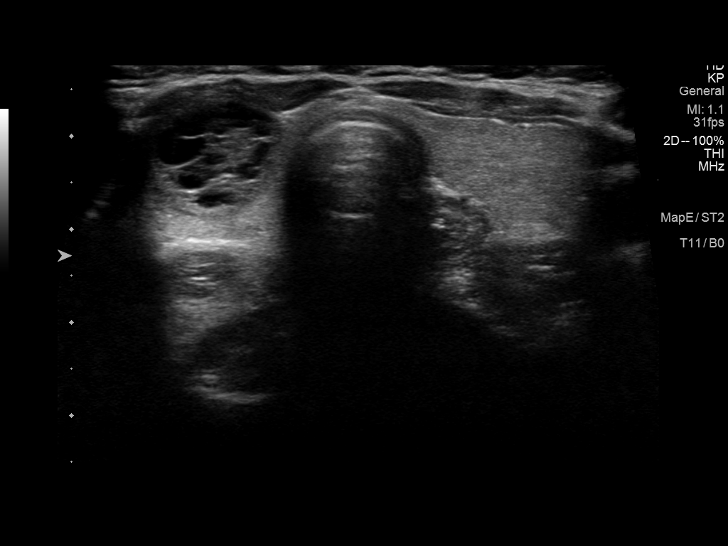
[im 62/83]
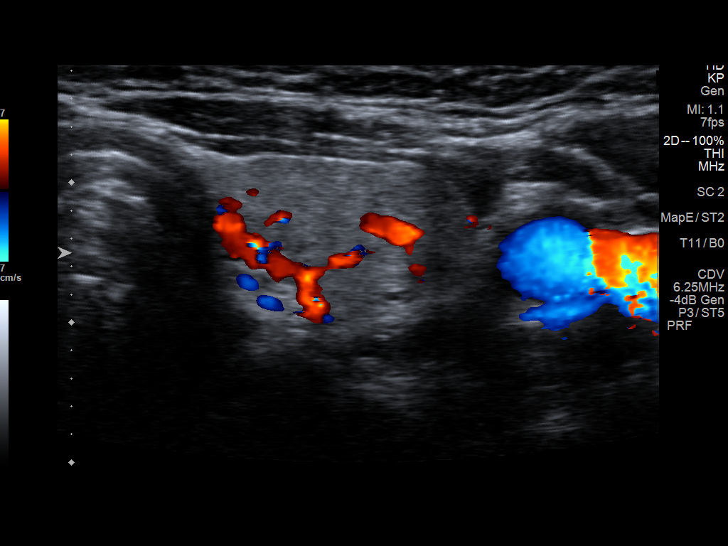
[im 69/83]
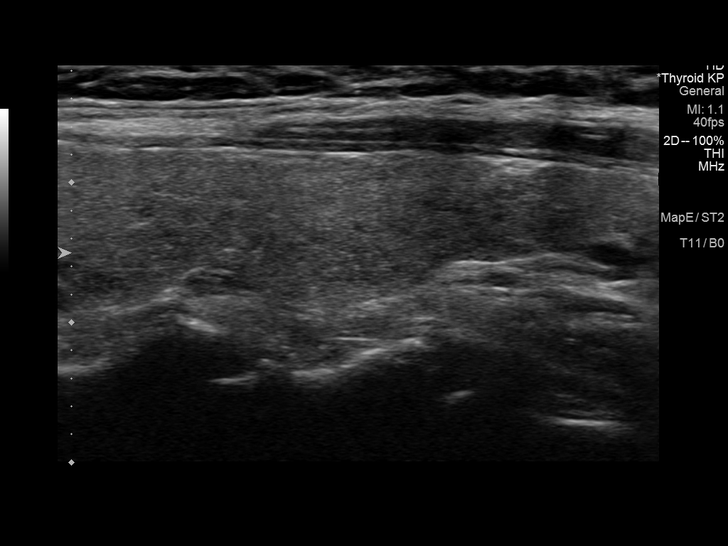
[im 76/83]
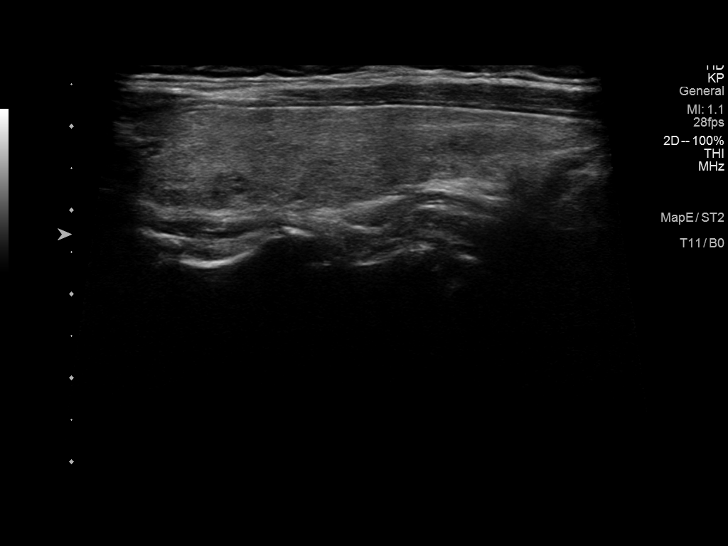
[im 83/83]
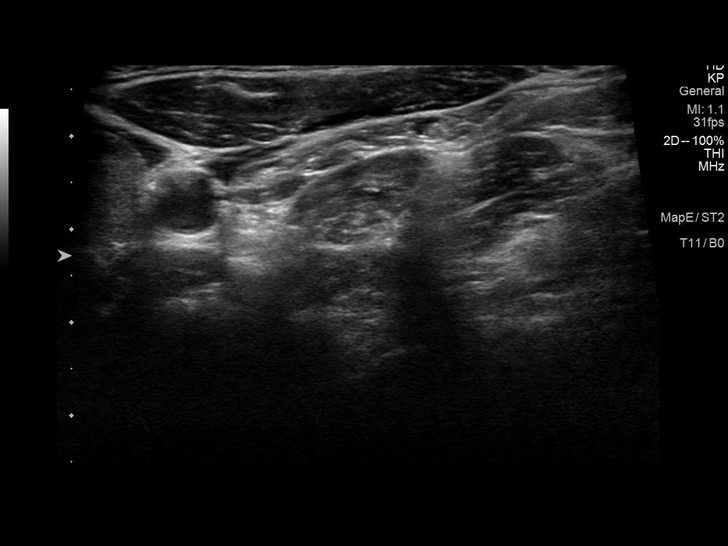

[13 of 25 positions shown; findings below may reference images not displayed]

FINDINGS: Parenchymal Echotexture: Mildly heterogenous

Isthmus: 0.3 cm

Right lobe: 6.3 x 1.9 x 2.1 cm

Left lobe: 5.8 x 1.3 x 1.9 cm

_________________________________________________________

Estimated total number of nodules >/= 1 cm: 2

Number of spongiform nodules >/=  2 cm not described below (TR1): 0

Number of mixed cystic and solid nodules >/= 1.5 cm not described
below (TR2): 0

_________________________________________________________

Nodule # 1:

Prior biopsy: No

Location: Right; Superior

Maximum size: 1.2 cm; Other 2 dimensions: 1 x 0.9 cm, previously,
1.2 x 1.2 x 0.9 cm

Composition: solid/almost completely solid (2)

Echogenicity: hypoechoic (2)

Shape: not taller-than-wide (0)

Margins: smooth (0)

Echogenic foci: none (0)

ACR TI-RADS total points: 4.

ACR TI-RADS risk category:  TR4 (4-6 points).

Significant change in size (>/= 20% in two dimensions and minimal
increase of 2 mm): No

Change in features: No

Change in ACR TI-RADS risk category: No

ACR TI-RADS recommendations:

*Given size (>/= 1 - 1.4 cm) and appearance, a follow-up ultrasound
in 1 year should be considered based on TI-RADS criteria.

_________________________________________________________

Nodule # 2:

Prior biopsy: No

Location: Right; Mid

Maximum size: 3 cm; Other 2 dimensions: 1.7 x 1.2 cm, previously,
2.8 x 1.7 x 1 cm

Composition: mixed cystic and solid (1)

Echogenicity: isoechoic (1)

Shape: not taller-than-wide (0)

Margins: ill-defined (0)

Echogenic foci: none (0)

ACR TI-RADS total points: 2.

ACR TI-RADS risk category:  TR2 (2 points).

Significant change in size (>/= 20% in two dimensions and minimal
increase of 2 mm): No

Change in features: Yes. The nodule appears to be a combination of
both spongiform composition and mixed cystic and solid.

Change in ACR TI-RADS risk category: No

ACR TI-RADS recommendations:

This nodule does NOT meet TI-RADS criteria for biopsy or dedicated
follow-up.

_________________________________________________________

There are additional subcentimeter right-sided thyroid nodules that
do not meet criteria for follow-up or FNA.
IMPRESSION: 1. Stable 1.2 cm TR 4 thyroid nodule in the right superior thyroid
gland. Again, a 1 year follow-up is recommended until 5 years of
stability is documented.
2. Stable TR2 thyroid nodule in the right mid thyroid gland
requiring no further follow-up.
3. There are additional subcentimeter right-sided thyroid nodules
that do not meet criteria for follow-up or FNA.

The above is in keeping with the ACR TI-RADS recommendations - [HOSPITAL] 7801;[DATE].

## 2022-06-05 ENCOUNTER — Encounter: Payer: BC Managed Care – PPO | Admitting: Internal Medicine

## 2022-06-20 ENCOUNTER — Encounter: Payer: Self-pay | Admitting: Internal Medicine

## 2022-06-20 ENCOUNTER — Ambulatory Visit (INDEPENDENT_AMBULATORY_CARE_PROVIDER_SITE_OTHER): Payer: BC Managed Care – PPO | Admitting: Internal Medicine

## 2022-06-20 VITALS — BP 118/80 | HR 82 | Ht 69.0 in | Wt 185.0 lb

## 2022-06-20 DIAGNOSIS — Z1211 Encounter for screening for malignant neoplasm of colon: Secondary | ICD-10-CM

## 2022-06-20 DIAGNOSIS — R1319 Other dysphagia: Secondary | ICD-10-CM

## 2022-06-20 DIAGNOSIS — K219 Gastro-esophageal reflux disease without esophagitis: Secondary | ICD-10-CM | POA: Diagnosis not present

## 2022-06-20 DIAGNOSIS — Z800 Family history of malignant neoplasm of digestive organs: Secondary | ICD-10-CM | POA: Diagnosis not present

## 2022-06-20 MED ORDER — PANTOPRAZOLE SODIUM 40 MG PO TBEC: 40.0000 mg | DELAYED_RELEASE_TABLET | Freq: Every day | ORAL | 11 refills | Status: DC

## 2022-06-20 NOTE — Progress Notes (Signed)
HISTORY OF PRESENT ILLNESS:  Natasha Jenkins is a 51 y.o. female with a family history of colon cancer in her father less than age 42 and grandparent.  Has undergone prior high rescreening colonoscopy in 2013 and again in 2018.  Both were negative for neoplasia.  She also has a history of chronic GERD dating back greater than 10 years.  She underwent upper endoscopy February 2013 and was found to have esophagitis as well as a distal stricture.  She presents today regarding the need for surveillance colonoscopy as well as a complaint of dysphagia.  She tells me that she also has active reflux symptoms.  She has been taking pantoprazole 40 mg 3 times per week.  She was concerned about possible medication side effects.  On this current dose that she experiences significant reflux symptoms.  She has had intermittent solid food dysphagia for several years.  Severe at times.  Quite troublesome.  No lower GI complaints at present.    REVIEW OF SYSTEMS:  All non-GI ROS negative unless otherwise stated in the HPI. Past Medical History:  Diagnosis Date   Allergy    Esophageal stricture    Esophagitis    GERD (gastroesophageal reflux disease)    patient report   Hyperlipidemia    Impaired fasting glucose    Multinodular goiter    Neuromuscular disorder ([place])    hiatal hernia    Past Surgical History:  Procedure Laterality Date   COLONOSCOPY     UPPER GASTROINTESTINAL ENDOSCOPY     uterine ablation      Social History Natasha Jenkins  reports that she quit smoking about 15 years ago. Her smoking use included cigarettes. She has never used smokeless tobacco. She reports current alcohol use of about 3.0 standard drinks of alcohol per week. She reports that she does not use drugs.  family history includes Colon cancer in her father and maternal grandmother; Colon polyps in her mother; Heart disease in her maternal grandmother; Prostate cancer in her paternal grandfather.  No Known Allergies      PHYSICAL EXAMINATION: Vital signs: BP 118/80   Pulse 82   Ht '5\' 9"'$  (1.753 m)   Wt 185 lb (83.9 kg)   SpO2 99%   BMI 27.32 kg/m   Constitutional: generally well-appearing, no acute distress Psychiatric: alert and oriented x3, cooperative Eyes: extraocular movements intact, anicteric, conjunctiva pink Mouth: oral pharynx moist, no lesions Neck: supple no lymphadenopathy Cardiovascular: heart regular rate and rhythm, no murmur Lungs: clear to auscultation bilaterally Abdomen: soft, nontender, nondistended, no obvious ascites, no peritoneal signs, normal bowel sounds, no organomegaly Rectal: Deferred until colonoscopy Extremities: no clubbing, cyanosis, or lower extremity edema bilaterally Skin: no lesions on visible extremities Neuro: No focal deficits.  Cranial nerves intact  ASSESSMENT:  1.  Chronic GERD.  Significant reflux symptoms on 3 times weekly PPI 2.  Intermittent solid food dysphagia.  Normal certainly due to peptic stricture. 3.  Colon cancer screening.  High risk given family history.  Previous examinations 2013, 2018.  Due for follow-up   PLAN:  1.  Reflux precautions 2.  Prescribe pantoprazole 40 mg daily.  Medication risks reviewed 3.  Schedule upper endoscopy with esophageal dilation.The nature of the procedure, as well as the risks, benefits, and alternatives were carefully and thoroughly reviewed with the patient. Ample time for discussion and questions allowed. The patient understood, was satisfied, and agreed to proceed.  4.  Schedule screening colonoscopy.The nature of the procedure, as well as the risks,  benefits, and alternatives were carefully and thoroughly reviewed with the patient. Ample time for discussion and questions allowed. The patient understood, was satisfied, and agreed to proceed.

## 2022-06-20 NOTE — Patient Instructions (Signed)
_______________________________________________________  If you are age 51 or older, your body mass index should be between 23-30. Your Body mass index is 27.32 kg/m. If this is out of the aforementioned range listed, please consider follow up with your Primary Care Provider.  If you are age 51 or younger, your body mass index should be between 19-25. Your Body mass index is 27.32 kg/m. If this is out of the aformentioned range listed, please consider follow up with your Primary Care Provider.   You have been scheduled for an endoscopy and colonoscopy. Please follow the written instructions given to you at your visit today. Please pick up your prep supplies at the pharmacy within the next 1-3 days. If you use inhalers (even only as needed), please bring them with you on the day of your procedure.   We have sent the following medications to your pharmacy for you to pick up at your convenience: Pantoprazole 40 mg daily.   The  GI providers would like to encourage you to use [facility] to communicate with providers for non-urgent requests or questions.  Due to long hold times on the telephone, sending your provider a message by [facility] may be a faster and more efficient way to get a response.  Please allow 48 business hours for a response.  Please remember that this is for non-urgent requests.   It was a pleasure to see you today!  Thank you for trusting me with your gastrointestinal care!

## 2022-06-21 ENCOUNTER — Encounter: Payer: Self-pay | Admitting: Internal Medicine

## 2022-06-27 ENCOUNTER — Ambulatory Visit (AMBULATORY_SURGERY_CENTER): Payer: BC Managed Care – PPO | Admitting: Internal Medicine

## 2022-06-27 ENCOUNTER — Encounter: Payer: Self-pay | Admitting: Internal Medicine

## 2022-06-27 VITALS — BP 106/70 | HR 59 | Temp 97.5°F | Resp 14 | Ht 69.0 in | Wt 185.0 lb

## 2022-06-27 DIAGNOSIS — Z800 Family history of malignant neoplasm of digestive organs: Secondary | ICD-10-CM | POA: Diagnosis not present

## 2022-06-27 DIAGNOSIS — R1319 Other dysphagia: Secondary | ICD-10-CM | POA: Diagnosis not present

## 2022-06-27 DIAGNOSIS — K635 Polyp of colon: Secondary | ICD-10-CM | POA: Diagnosis not present

## 2022-06-27 DIAGNOSIS — D120 Benign neoplasm of cecum: Secondary | ICD-10-CM | POA: Diagnosis not present

## 2022-06-27 DIAGNOSIS — D122 Benign neoplasm of ascending colon: Secondary | ICD-10-CM

## 2022-06-27 DIAGNOSIS — K219 Gastro-esophageal reflux disease without esophagitis: Secondary | ICD-10-CM | POA: Diagnosis not present

## 2022-06-27 DIAGNOSIS — Z1211 Encounter for screening for malignant neoplasm of colon: Secondary | ICD-10-CM

## 2022-06-27 DIAGNOSIS — K222 Esophageal obstruction: Secondary | ICD-10-CM | POA: Diagnosis not present

## 2022-06-27 DIAGNOSIS — K317 Polyp of stomach and duodenum: Secondary | ICD-10-CM

## 2022-06-27 DIAGNOSIS — K6389 Other specified diseases of intestine: Secondary | ICD-10-CM | POA: Diagnosis not present

## 2022-06-27 MED ORDER — SODIUM CHLORIDE 0.9 % IV SOLN: 500.0000 mL | Freq: Once | INTRAVENOUS | Status: DC

## 2022-06-27 NOTE — Progress Notes (Signed)
HISTORY OF PRESENT ILLNESS:   Natasha Jenkins is a 51 y.o. female with a family history of colon cancer in her father less than age 63 and grandparent.  Has undergone prior high rescreening colonoscopy in 2013 and again in 2018.  Both were negative for neoplasia.  She also has a history of chronic GERD dating back greater than 10 years.  She underwent upper endoscopy February 2013 and was found to have esophagitis as well as a distal stricture.   She presents today regarding the need for surveillance colonoscopy as well as a complaint of dysphagia.  She tells me that she also has active reflux symptoms.  She has been taking pantoprazole 40 mg 3 times per week.  She was concerned about possible medication side effects.  On this current dose that she experiences significant reflux symptoms.  She has had intermittent solid food dysphagia for several years.  Severe at times.  Quite troublesome.  No lower GI complaints at present.       REVIEW OF SYSTEMS:   All non-GI ROS negative unless otherwise stated in the HPI.     Past Medical History:  Diagnosis Date   Allergy     Esophageal stricture     Esophagitis     GERD (gastroesophageal reflux disease)      patient report   Hyperlipidemia     Impaired fasting glucose     Multinodular goiter     Neuromuscular disorder ([place])      hiatal hernia           Past Surgical History:  Procedure Laterality Date   COLONOSCOPY       UPPER GASTROINTESTINAL ENDOSCOPY       uterine ablation          Social History Natasha Jenkins  reports that she quit smoking about 15 years ago. Her smoking use included cigarettes. She has never used smokeless tobacco. She reports current alcohol use of about 3.0 standard drinks of alcohol per week. She reports that she does not use drugs.   family history includes Colon cancer in her father and maternal grandmother; Colon polyps in her mother; Heart disease in her maternal grandmother; Prostate cancer in her paternal  grandfather.   No Known Allergies       PHYSICAL EXAMINATION: Vital signs: BP 118/80   Pulse 82   Ht '5\' 9"'$  (1.753 m)   Wt 185 lb (83.9 kg)   SpO2 99%   BMI 27.32 kg/m   Constitutional: generally well-appearing, no acute distress Psychiatric: alert and oriented x3, cooperative Eyes: extraocular movements intact, anicteric, conjunctiva pink Mouth: oral pharynx moist, no lesions Neck: supple no lymphadenopathy Cardiovascular: heart regular rate and rhythm, no murmur Lungs: clear to auscultation bilaterally Abdomen: soft, nontender, nondistended, no obvious ascites, no peritoneal signs, normal bowel sounds, no organomegaly Rectal: Deferred until colonoscopy Extremities: no clubbing, cyanosis, or lower extremity edema bilaterally Skin: no lesions on visible extremities Neuro: No focal deficits.  Cranial nerves intact   ASSESSMENT:   1.  Chronic GERD.  Significant reflux symptoms on 3 times weekly PPI 2.  Intermittent solid food dysphagia.  Normal certainly due to peptic stricture. 3.  Colon cancer screening.  High risk given family history.  Previous examinations 2013, 2018.  Due for follow-up     PLAN:   1.  Reflux precautions 2.  Prescribe pantoprazole 40 mg daily.  Medication risks reviewed 3.  Schedule upper endoscopy with esophageal dilation.The nature of the procedure, as well as  the risks, benefits, and alternatives were carefully and thoroughly reviewed with the patient. Ample time for discussion and questions allowed. The patient understood, was satisfied, and agreed to proceed.  4.  Schedule screening colonoscopy.The nature of the procedure, as well as the risks, benefits, and alternatives were carefully and thoroughly reviewed with the patient. Ample time for discussion and questions allowed. The patient understood, was satisfied, and agreed to proceed.

## 2022-06-27 NOTE — Op Note (Signed)
[place] Patient Name: Natasha Jenkins Procedure Date: 06/27/2022 11:40 AM MRN: [id number] Endoscopist: Docia Chuck. Henrene Pastor , MD Age: 51 Referring MD:  Date of Birth: 19-Dec-1970 Gender: Female Account #: [id number] Procedure:                Upper GI endoscopy with [facility] dilation of the                            esophagus. 23 [address] Indications:              Dysphagia. Chronic GERD Medicines:                Monitored Anesthesia Care Procedure:                Pre-Anesthesia Assessment:                           - Prior to the procedure, a History and Physical                            was performed, and patient medications and                            allergies were reviewed. The patient's tolerance of                            previous anesthesia was also reviewed. The risks                            and benefits of the procedure and the sedation                            options and risks were discussed with the patient.                            All questions were answered, and informed consent                            was obtained. Prior Anticoagulants: The patient has                            taken no previous anticoagulant or antiplatelet                            agents. ASA Grade Assessment: I - A normal, healthy                            patient. After reviewing the risks and benefits,                            the patient was deemed in satisfactory condition to                            undergo the procedure.  After obtaining informed consent, the endoscope was                            passed under direct vision. Throughout the                            procedure, the patient's blood pressure, pulse, and                            oxygen saturations were monitored continuously. The                            Endoscope was introduced through the mouth, and                            advanced to the second part of duodenum. The  upper                            GI endoscopy was accomplished without difficulty.                            The patient tolerated the procedure well. Scope In: Scope Out: Findings:                 One benign-appearing, intrinsic moderate stenosis                            was found 40 cm from the incisors. This stenosis                            measured 1.5 cm (inner diameter). After completing                            the endoscopic survey, the scope was withdrawn.                            Dilation was performed with a Maloney dilator with                            no resistance at 69 Fr. No heme                           The exam of the esophagus was otherwise normal. No                            active inflammation or Barrett's.                           The stomach was normal, save small hiatal hernia                            and a few diminutive benign fundic gland type  polyps.                           The examined duodenum was normal.                           The cardia and gastric fundus were normal on                            retroflexion. Complications:            No immediate complications. Estimated Blood Loss:     Estimated blood loss: none. Impression:               1. GERD complicated by esophageal stricture status                            post dilation                           2. Otherwise unremarkable EGD. Recommendation:           1. Patient has a contact number available for                            emergencies. The signs and symptoms of potential                            delayed complications were discussed with the                            patient. Return to normal activities tomorrow.                            Written discharge instructions were provided to the                            patient.                           2. Post dilation diet.                           3. Continue present medications. Take  pantoprazole                            40 mg EVERY day.                           4. Routine office follow-up with Dr. Henrene Pastor in 1 year Docia Chuck. Henrene Pastor, MD 06/27/2022 12:18:04 PM This report has been signed electronically.

## 2022-06-27 NOTE — Progress Notes (Signed)
Sedate, gd SR, tolerated procedure well, VSS, report to RN 

## 2022-06-27 NOTE — Progress Notes (Signed)
Pt's states no medical or surgical changes since previsit or office visit. 

## 2022-06-27 NOTE — Op Note (Signed)
[place] Patient Name: Natasha Jenkins Procedure Date: 06/27/2022 11:39 AM MRN: [id number] Endoscopist: Docia Chuck. Henrene Pastor , MD Age: 51 Referring MD:  Date of Birth: January 28, 1971 Gender: Female Account #: [id number] Procedure:                Colonoscopy with cold snare polypectomy x 1; with                            biopsy polypectomy x 1 Indications:              Screening in patient at increased risk: Colorectal                            cancer in father before age 52. Previous                            examinations 2013 and 2018 were negative for                            neoplasia Medicines:                Monitored Anesthesia Care Procedure:                Pre-Anesthesia Assessment:                           - Prior to the procedure, a History and Physical                            was performed, and patient medications and                            allergies were reviewed. The patient's tolerance of                            previous anesthesia was also reviewed. The risks                            and benefits of the procedure and the sedation                            options and risks were discussed with the patient.                            All questions were answered, and informed consent                            was obtained. Prior Anticoagulants: The patient has                            taken no previous anticoagulant or antiplatelet                            agents. ASA Grade Assessment: I - A normal, healthy  patient. After reviewing the risks and benefits,                            the patient was deemed in satisfactory condition to                            undergo the procedure.                           After obtaining informed consent, the colonoscope                            was passed under direct vision. Throughout the                            procedure, the patient's blood pressure, pulse, and                             oxygen saturations were monitored continuously. The                            Olympus CF-HQ190L [id number]) Colonoscope was                            introduced through the anus and advanced to the the                            cecum, identified by appendiceal orifice and                            ileocecal valve. The ileocecal valve, appendiceal                            orifice, and rectum were photographed. The quality                            of the bowel preparation was excellent. The                            colonoscopy was performed without difficulty. The                            patient tolerated the procedure well. The bowel                            preparation used was SUPREP via split dose                            instruction. Scope In: 11:48:48 AM Scope Out: 12:01:15 PM Scope Withdrawal Time: 0 hours 10 minutes 26 seconds  Total Procedure Duration: 0 hours 12 minutes 27 seconds  Findings:                 A 2 mm polyp was found in the cecum. The polyp was  removed with a cold snare. Resection and retrieval                            were complete.                           A 2 mm polyp was found in the ascending colon. The                            polyp was removed with a jumbo cold forceps.                            Resection and retrieval were complete.                           The exam was otherwise without abnormality on                            direct and retroflexion views. Complications:            No immediate complications. Estimated blood loss:                            None. Estimated Blood Loss:     Estimated blood loss: none. Impression:               - One 2 mm polyp in the cecum, removed with a cold                            snare. Resected and retrieved.                           - One 2 mm polyp in the ascending colon, removed                            with a jumbo cold forceps. Resected and  retrieved.                           - The examination was otherwise normal on direct                            and retroflexion views. Recommendation:           - Repeat colonoscopy in 5 years for surveillance.                           - Patient has a contact number available for                            emergencies. The signs and symptoms of potential                            delayed complications were discussed with the  patient. Return to normal activities tomorrow.                            Written discharge instructions were provided to the                            patient.                           - Resume previous diet.                           - Continue present medications.                           - Await pathology results. Docia Chuck. Henrene Pastor, MD 06/27/2022 12:06:50 PM This report has been signed electronically.

## 2022-06-27 NOTE — Progress Notes (Signed)
Called to room to assist during endoscopic procedure.  Patient ID and intended procedure confirmed with present staff. Received instructions for my participation in the procedure from the performing physician.  

## 2022-06-27 NOTE — Patient Instructions (Addendum)
Information on polyps given to you today.  Await pathology results.  Post dilation diet today - see handout.  Continue present medications including Pantoprazole 40 mg EVERY day.  Routine follow up with Dr. Henrene Pastor in 1 year in office.   YOU HAD AN ENDOSCOPIC PROCEDURE TODAY AT [facility]:   Refer to the procedure report that was given to you for any specific questions about what was found during the examination.  If the procedure report does not answer your questions, please call your gastroenterologist to clarify.  If you requested that your care partner not be given the details of your procedure findings, then the procedure report has been included in a sealed envelope for you to review at your convenience later.  YOU SHOULD EXPECT: Some feelings of bloating in the abdomen. Passage of more gas than usual.  Walking can help get rid of the air that was put into your GI tract during the procedure and reduce the bloating. If you had a lower endoscopy (such as a colonoscopy or flexible sigmoidoscopy) you may notice spotting of blood in your stool or on the toilet paper. If you underwent a bowel prep for your procedure, you may not have a normal bowel movement for a few days.  Please Note:  You might notice some irritation and congestion in your nose or some drainage.  This is from the oxygen used during your procedure.  There is no need for concern and it should clear up in a day or so.  SYMPTOMS TO REPORT IMMEDIATELY:  Following lower endoscopy (colonoscopy or flexible sigmoidoscopy):  Excessive amounts of blood in the stool  Significant tenderness or worsening of abdominal pains  Swelling of the abdomen that is new, acute  Fever of 100F or higher  Following upper endoscopy (EGD)  Vomiting of blood or coffee ground material  New chest pain or pain under the shoulder blades  Painful or persistently difficult swallowing  New shortness of breath  Fever of 100F or  higher  Black, tarry-looking stools  For urgent or emergent issues, a gastroenterologist can be reached at any hour by calling [phone]. Do not use MyChart messaging for urgent concerns.    DIET:  We do recommend a small meal at first, but then you may proceed to your regular diet.  Drink plenty of fluids but you should avoid alcoholic beverages for 24 hours.  ACTIVITY:  You should plan to take it easy for the rest of today and you should NOT DRIVE or use heavy machinery until tomorrow (because of the sedation medicines used during the test).    FOLLOW UP: Our staff will call the number listed on your records the next business day following your procedure.  We will call around 7:15- 8:00 am to check on you and address any questions or concerns that you may have regarding the information given to you following your procedure. If we do not reach you, we will leave a message.  If you develop any symptoms (ie: fever, flu-like symptoms, shortness of breath, cough etc.) before then, please call [phone].  If you test positive for Covid 19 in the 2 weeks post procedure, please call and report this information to [place].    If any biopsies were taken you will be contacted by phone or by letter within the next 1-3 weeks.  Please call us at [phone] if you have not heard about the biopsies in 3 weeks.    SIGNATURES/CONFIDENTIALITY: You and/or your  care partner have signed paperwork which will be entered into your electronic medical record.  These signatures attest to the fact that that the information above on your After Visit Summary has been reviewed and is understood.  Full responsibility of the confidentiality of this discharge information lies with you and/or your care-partner.

## 2022-06-28 ENCOUNTER — Telehealth: Payer: Self-pay

## 2022-06-28 NOTE — Telephone Encounter (Signed)
  Follow up Call-     06/27/2022   10:42 AM  Call back number  Post procedure Call Back phone  # [phone]  Permission to leave phone message Yes     Patient questions:  Do you have a fever, pain , or abdominal swelling? No. Pain Score  0 *  Have you tolerated food without any problems? Yes.    Have you been able to return to your normal activities? Yes.    Do you have any questions about your discharge instructions: Diet   No. Medications  No. Follow up visit  No.  Do you have questions or concerns about your Care? No.  Actions: * If pain score is 4 or above: No action needed, pain <4.

## 2022-07-02 ENCOUNTER — Encounter: Payer: Self-pay | Admitting: Internal Medicine

## 2022-07-18 DIAGNOSIS — R7301 Impaired fasting glucose: Secondary | ICD-10-CM | POA: Diagnosis not present

## 2022-07-18 DIAGNOSIS — E785 Hyperlipidemia, unspecified: Secondary | ICD-10-CM | POA: Diagnosis not present

## 2022-07-18 DIAGNOSIS — E042 Nontoxic multinodular goiter: Secondary | ICD-10-CM | POA: Diagnosis not present

## 2022-07-23 DIAGNOSIS — Z0000 Encounter for general adult medical examination without abnormal findings: Secondary | ICD-10-CM | POA: Diagnosis not present

## 2022-07-23 DIAGNOSIS — Z1339 Encounter for screening examination for other mental health and behavioral disorders: Secondary | ICD-10-CM | POA: Diagnosis not present

## 2022-07-23 DIAGNOSIS — Z1331 Encounter for screening for depression: Secondary | ICD-10-CM | POA: Diagnosis not present

## 2022-07-23 DIAGNOSIS — Z23 Encounter for immunization: Secondary | ICD-10-CM | POA: Diagnosis not present

## 2022-07-23 DIAGNOSIS — R030 Elevated blood-pressure reading, without diagnosis of hypertension: Secondary | ICD-10-CM | POA: Diagnosis not present

## 2022-08-14 DIAGNOSIS — Z6827 Body mass index (BMI) 27.0-27.9, adult: Secondary | ICD-10-CM | POA: Diagnosis not present

## 2022-08-14 DIAGNOSIS — Z1231 Encounter for screening mammogram for malignant neoplasm of breast: Secondary | ICD-10-CM | POA: Diagnosis not present

## 2022-08-14 DIAGNOSIS — Z01419 Encounter for gynecological examination (general) (routine) without abnormal findings: Secondary | ICD-10-CM | POA: Diagnosis not present

## 2022-08-14 DIAGNOSIS — Z124 Encounter for screening for malignant neoplasm of cervix: Secondary | ICD-10-CM | POA: Diagnosis not present

## 2022-09-19 DIAGNOSIS — E042 Nontoxic multinodular goiter: Secondary | ICD-10-CM | POA: Diagnosis not present

## 2022-09-19 DIAGNOSIS — E049 Nontoxic goiter, unspecified: Secondary | ICD-10-CM | POA: Diagnosis not present

## 2022-11-23 ENCOUNTER — Other Ambulatory Visit: Payer: Self-pay | Admitting: Surgery

## 2022-11-23 DIAGNOSIS — E049 Nontoxic goiter, unspecified: Secondary | ICD-10-CM

## 2022-11-23 DIAGNOSIS — E042 Nontoxic multinodular goiter: Secondary | ICD-10-CM

## 2022-12-10 DIAGNOSIS — D225 Melanocytic nevi of trunk: Secondary | ICD-10-CM | POA: Diagnosis not present

## 2022-12-10 DIAGNOSIS — L738 Other specified follicular disorders: Secondary | ICD-10-CM | POA: Diagnosis not present

## 2022-12-10 DIAGNOSIS — L814 Other melanin hyperpigmentation: Secondary | ICD-10-CM | POA: Diagnosis not present

## 2022-12-10 DIAGNOSIS — L821 Other seborrheic keratosis: Secondary | ICD-10-CM | POA: Diagnosis not present

## 2022-12-11 IMAGING — US US THYROID
1 series · 13 of 25 positions shown · non-contrast
Comparison: 05/09/2020

CLINICAL DATA: Thyroid nodule follow-up

EXAM:
THYROID ULTRASOUND
TECHNIQUE: Ultrasound examination of the thyroid gland and adjacent soft
tissues was performed.

[Series 1: us thyroid · 0.07mm/px · 13 of 37 slices shown]
[im 1/37]
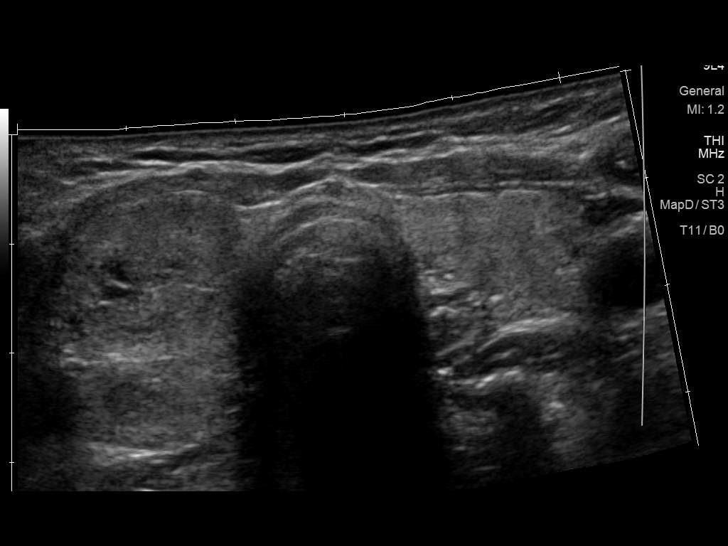
[im 4/37]
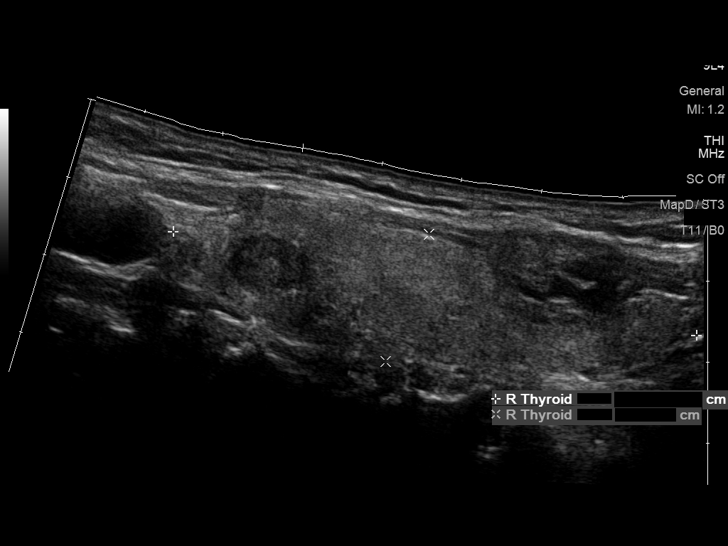
[im 7/37]
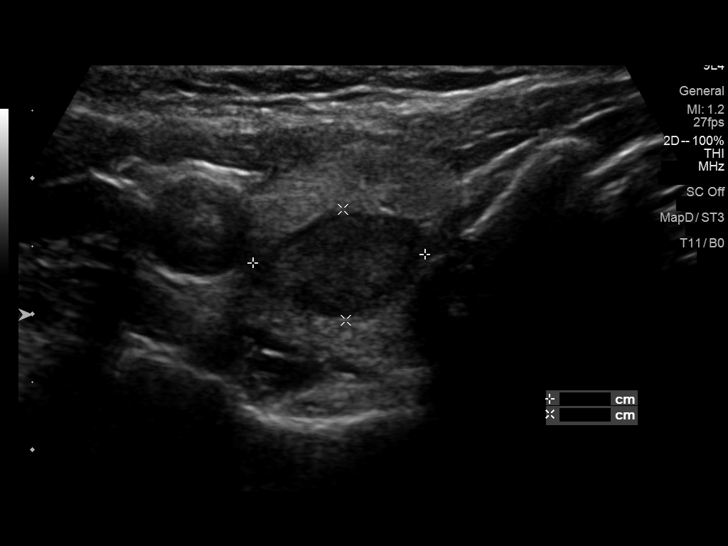
[im 10/37]
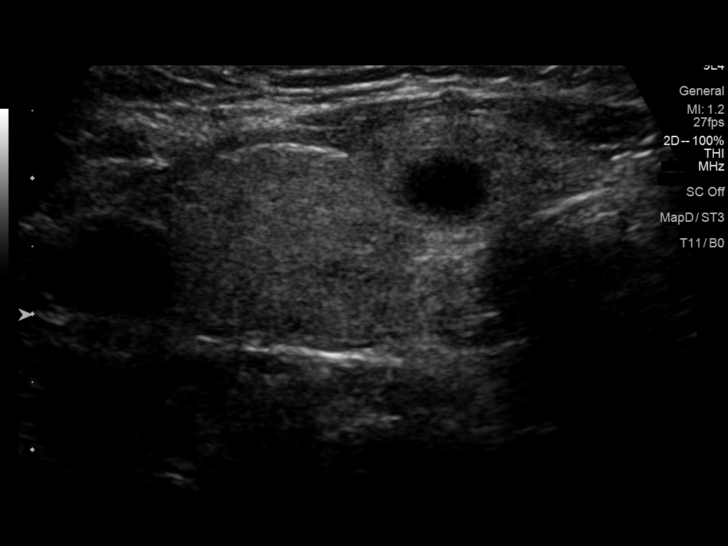
[im 13/37]
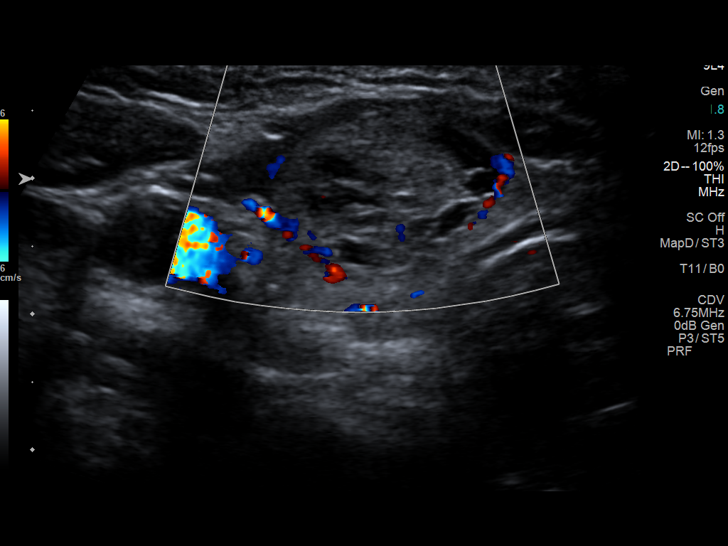
[im 16/37]
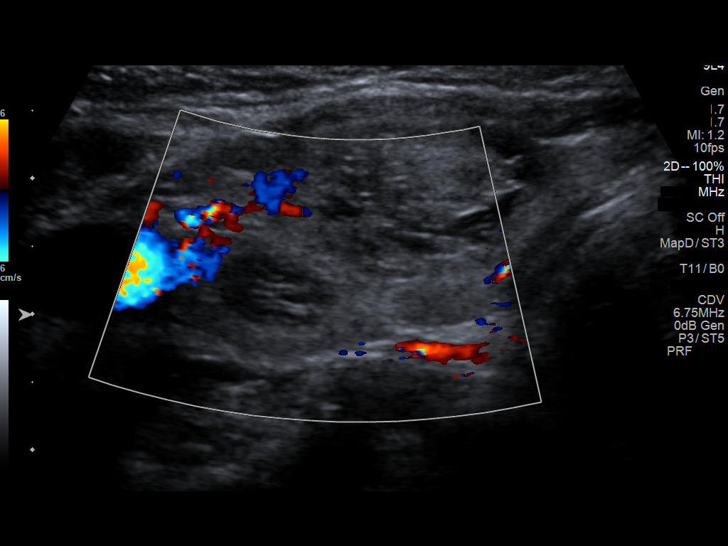
[im 19/37]
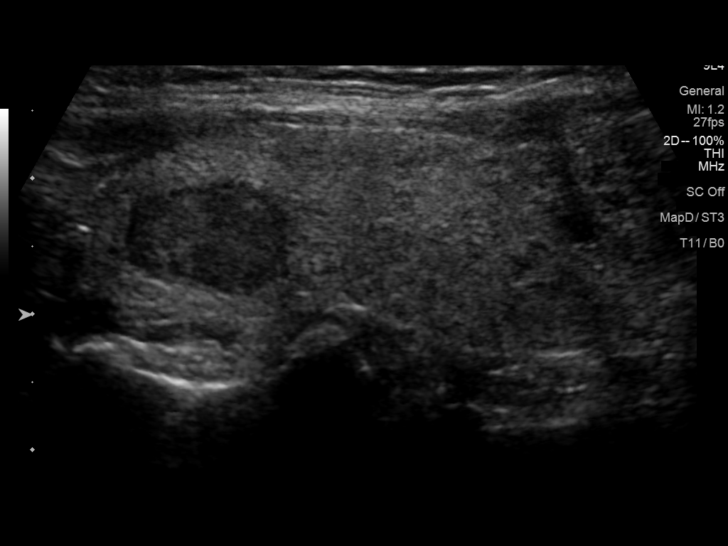
[im 22/37]
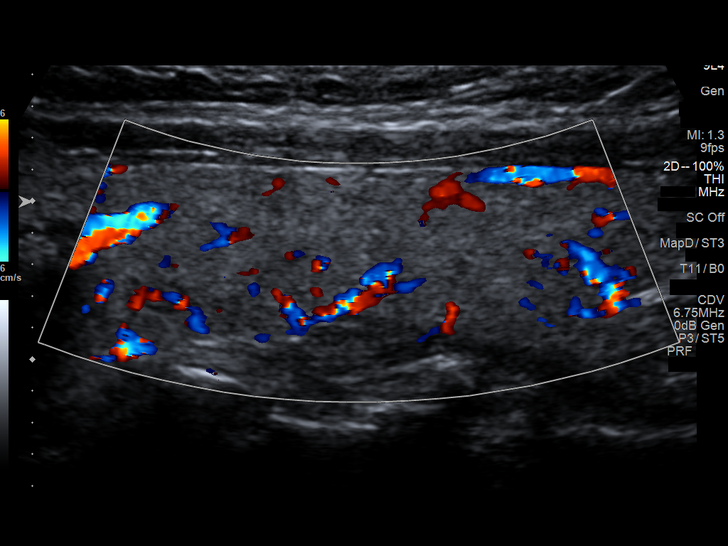
[im 25/37]
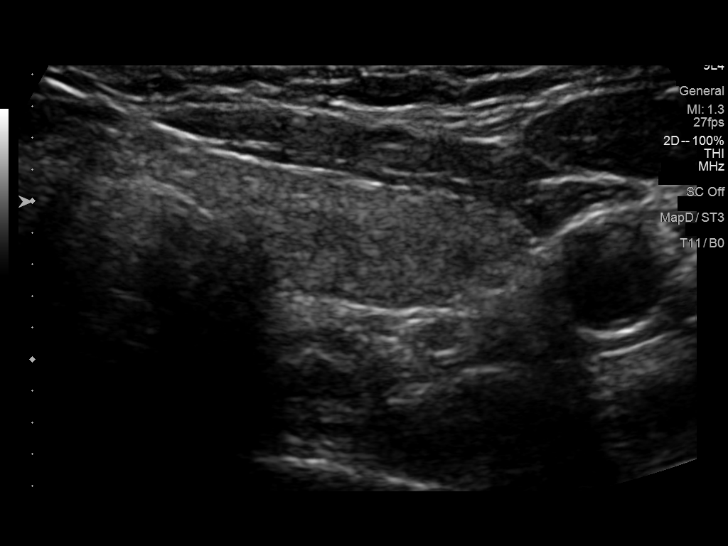
[im 28/37]
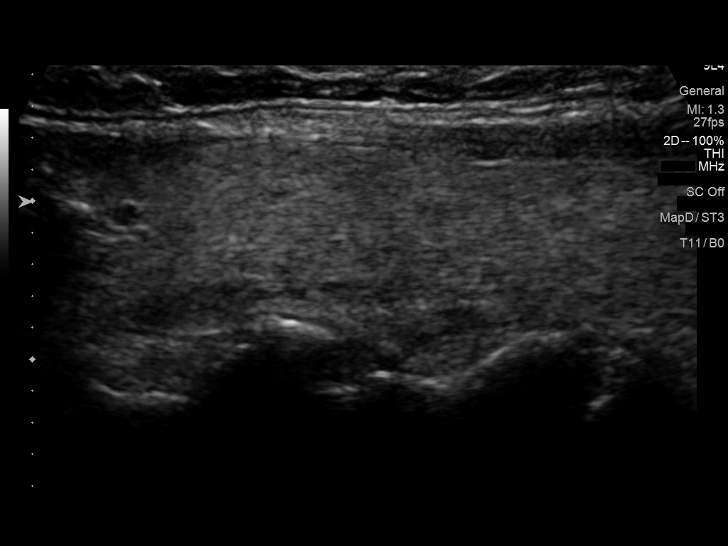
[im 31/37]
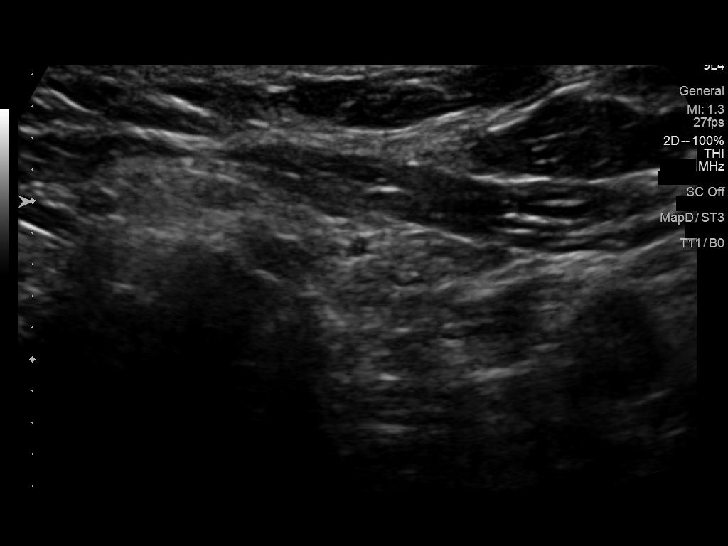
[im 34/37]
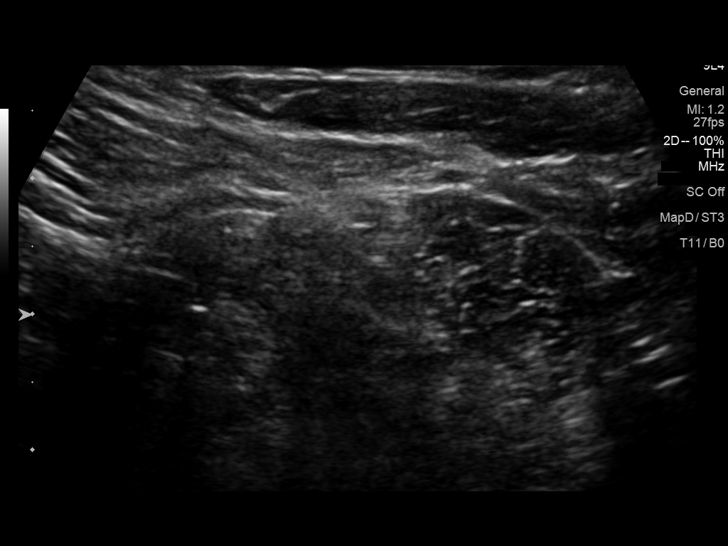
[im 37/37]
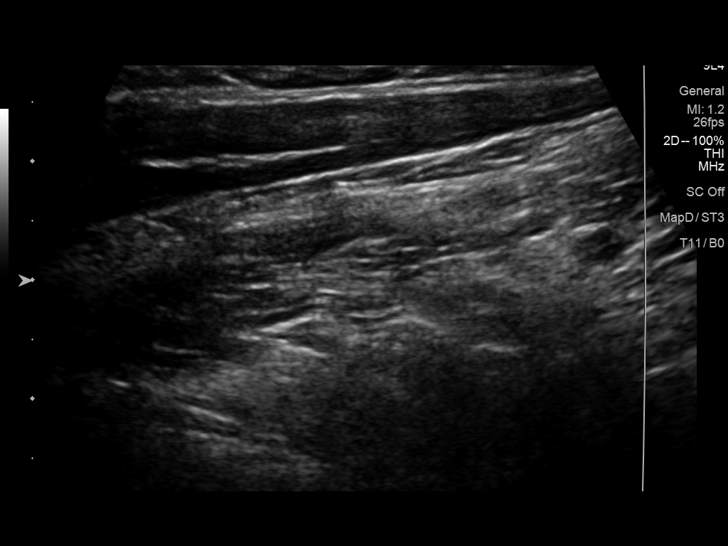

[13 of 25 positions shown; findings below may reference images not displayed]

FINDINGS: Parenchymal Echotexture: Normal

Isthmus: 0.5 cm

Right lobe: 6.6 x 1.7 x 2 cm

Left lobe: 5.1 x 1.2 x 1.8 cm

_________________________________________________________

Estimated total number of nodules >/= 1 cm: 3

Number of spongiform nodules >/=  2 cm not described below (TR1): 0

Number of mixed cystic and solid nodules >/= 1.5 cm not described
below (TR2): 0

_________________________________________________________

Nodule # 1:

Prior biopsy: No

Location: Right; Superior

Maximum size: 1.3 cm; Other 2 dimensions: 1.1 x 0.8 cm, previously,
1.2 x 1 x 0.9 cm

Composition: solid/almost completely solid (2)

Echogenicity: hypoechoic (2)

Shape: not taller-than-wide (0)

Margins: ill-defined (0)

Echogenic foci: none (0)

ACR TI-RADS total points: 4.

ACR TI-RADS risk category:  TR4 (4-6 points).

Significant change in size (>/= 20% in two dimensions and minimal
increase of 2 mm): No

Change in features: No

Change in ACR TI-RADS risk category: No

ACR TI-RADS recommendations:

*Given size (>/= 1 - 1.4 cm) and appearance, a follow-up ultrasound
in 1 year should be considered based on TI-RADS criteria.

_________________________________________________________

Nodule # 2:

Prior biopsy: No

Location: Right; Mid

Maximum size: 2.7 cm; Other 2 dimensions: 1.6 x 1.2 cm, previously,
3 x 1.7 x 1.2 cm

Composition: mixed cystic and solid (1)

Echogenicity: isoechoic (1)

Shape: not taller-than-wide (0)

Margins: ill-defined (0)

Echogenic foci: none (0)

ACR TI-RADS total points: 2.

ACR TI-RADS risk category:  TR2 (2 points).

Significant change in size (>/= 20% in two dimensions and minimal
increase of 2 mm): No

Change in features: No

Change in ACR TI-RADS risk category: No

ACR TI-RADS recommendations:

This nodule does NOT meet TI-RADS criteria for biopsy or dedicated
follow-up.

_________________________________________________________

There is an additional small 1 cm thyroid nodule in the right
inferior thyroid gland that appears to be spongiform in echotexture
and therefore is benign.
IMPRESSION: Stable TR 4 thyroid nodule in the right superior thyroid gland. A 1
year follow-up ultrasound is recommended. This thyroid nodule has
now demonstrated approximately 1 year of stability.

The above is in keeping with the ACR TI-RADS recommendations - [HOSPITAL] 4511;[DATE].

## 2022-12-14 ENCOUNTER — Ambulatory Visit
Admission: RE | Admit: 2022-12-14 | Discharge: 2022-12-14 | Disposition: A | Discharge status: Home / Self Care | Payer: BC Managed Care – PPO | Source: Ambulatory Visit | Attending: Surgery | Admitting: Surgery

## 2022-12-14 DIAGNOSIS — E042 Nontoxic multinodular goiter: Secondary | ICD-10-CM

## 2022-12-14 DIAGNOSIS — E049 Nontoxic goiter, unspecified: Secondary | ICD-10-CM

## 2023-08-16 ENCOUNTER — Other Ambulatory Visit: Payer: Self-pay | Admitting: Internal Medicine

## 2024-05-14 ENCOUNTER — Other Ambulatory Visit: Payer: Self-pay | Admitting: *Deleted

## 2024-05-14 DIAGNOSIS — R002 Palpitations: Secondary | ICD-10-CM

## 2024-05-15 ENCOUNTER — Ambulatory Visit

## 2024-06-09 ENCOUNTER — Ambulatory Visit: Attending: Registered Nurse

## 2024-06-09 DIAGNOSIS — R002 Palpitations: Secondary | ICD-10-CM

## 2024-06-11 DIAGNOSIS — R002 Palpitations: Secondary | ICD-10-CM | POA: Diagnosis not present

## 2024-08-06 ENCOUNTER — Other Ambulatory Visit: Payer: Self-pay | Admitting: *Deleted

## 2024-08-06 ENCOUNTER — Ambulatory Visit: Attending: Registered Nurse

## 2024-08-06 DIAGNOSIS — R002 Palpitations: Secondary | ICD-10-CM

## 2024-08-06 NOTE — Progress Notes (Unsigned)
 Enrolled for [employer] to mail a ZIO XT long term holter monitor to the patients address on file.   EP to read

## 2024-09-06 DIAGNOSIS — R002 Palpitations: Secondary | ICD-10-CM
